# Patient Record
Sex: Female | Born: 1970 | Race: White | Hispanic: No | Smoking: Current every day smoker
Health system: Southern US, Community
[De-identification: ages and names within clinical notes are randomized; demographics above are authoritative.]

## PROBLEM LIST (undated history)

## (undated) DIAGNOSIS — K5090 Crohn's disease, unspecified, without complications: Secondary | ICD-10-CM

## (undated) DIAGNOSIS — K589 Irritable bowel syndrome without diarrhea: Secondary | ICD-10-CM

## (undated) DIAGNOSIS — K635 Polyp of colon: Secondary | ICD-10-CM

## (undated) DIAGNOSIS — K611 Rectal abscess: Secondary | ICD-10-CM

## (undated) DIAGNOSIS — R5383 Other fatigue: Secondary | ICD-10-CM

## (undated) DIAGNOSIS — F1020 Alcohol dependence, uncomplicated: Secondary | ICD-10-CM

## (undated) DIAGNOSIS — E538 Deficiency of other specified B group vitamins: Secondary | ICD-10-CM

## (undated) DIAGNOSIS — F329 Major depressive disorder, single episode, unspecified: Secondary | ICD-10-CM

## (undated) DIAGNOSIS — Z803 Family history of malignant neoplasm of breast: Secondary | ICD-10-CM

## (undated) DIAGNOSIS — K602 Anal fissure, unspecified: Secondary | ICD-10-CM

## (undated) HISTORY — DX: Deficiency of other specified B group vitamins: E53.8

## (undated) HISTORY — DX: Major depressive disorder, single episode, unspecified: F32.9

## (undated) HISTORY — PX: OTHER SURGICAL HISTORY: SHX169

## (undated) HISTORY — PX: COLON SURGERY: SHX602

## (undated) HISTORY — DX: Family history of malignant neoplasm of breast: Z80.3

## (undated) HISTORY — DX: Crohn's disease, unspecified, without complications: K50.90

## (undated) HISTORY — DX: Rectal abscess: K61.1

## (undated) HISTORY — DX: Alcohol dependence, uncomplicated: F10.20

## (undated) HISTORY — DX: Polyp of colon: K63.5

## (undated) HISTORY — DX: Anal fissure, unspecified: K60.2

## (undated) HISTORY — DX: Irritable bowel syndrome, unspecified: K58.9

## (undated) HISTORY — DX: Other fatigue: R53.83

---

## 1989-12-29 DIAGNOSIS — K611 Rectal abscess: Secondary | ICD-10-CM

## 1989-12-29 HISTORY — DX: Rectal abscess: K61.1

## 1998-01-19 ENCOUNTER — Encounter: Payer: Self-pay | Admitting: Internal Medicine

## 1998-09-21 ENCOUNTER — Encounter: Payer: Self-pay | Admitting: Internal Medicine

## 1998-09-21 ENCOUNTER — Ambulatory Visit (HOSPITAL_COMMUNITY): Admission: RE | Admit: 1998-09-21 | Discharge: 1998-09-21 | Payer: Self-pay | Admitting: Gastroenterology

## 1998-10-11 ENCOUNTER — Other Ambulatory Visit: Admission: RE | Admit: 1998-10-11 | Discharge: 1998-10-11 | Payer: Self-pay | Admitting: Obstetrics & Gynecology

## 1999-09-18 ENCOUNTER — Other Ambulatory Visit: Admission: RE | Admit: 1999-09-18 | Discharge: 1999-09-18 | Payer: Self-pay | Admitting: Obstetrics & Gynecology

## 2000-06-05 ENCOUNTER — Ambulatory Visit (HOSPITAL_COMMUNITY): Admission: RE | Admit: 2000-06-05 | Discharge: 2000-06-05 | Payer: Self-pay | Admitting: Gastroenterology

## 2000-06-05 ENCOUNTER — Encounter: Payer: Self-pay | Admitting: Internal Medicine

## 2000-06-05 ENCOUNTER — Encounter (INDEPENDENT_AMBULATORY_CARE_PROVIDER_SITE_OTHER): Payer: Self-pay | Admitting: *Deleted

## 2000-11-18 ENCOUNTER — Other Ambulatory Visit: Admission: RE | Admit: 2000-11-18 | Discharge: 2000-11-18 | Payer: Self-pay | Admitting: Obstetrics & Gynecology

## 2002-02-02 ENCOUNTER — Other Ambulatory Visit: Admission: RE | Admit: 2002-02-02 | Discharge: 2002-02-02 | Payer: Self-pay | Admitting: Obstetrics & Gynecology

## 2003-09-13 ENCOUNTER — Other Ambulatory Visit: Admission: RE | Admit: 2003-09-13 | Discharge: 2003-09-13 | Payer: Self-pay | Admitting: Obstetrics and Gynecology

## 2006-08-27 ENCOUNTER — Encounter (INDEPENDENT_AMBULATORY_CARE_PROVIDER_SITE_OTHER): Payer: Self-pay | Admitting: *Deleted

## 2006-08-27 ENCOUNTER — Emergency Department (HOSPITAL_COMMUNITY): Admission: EM | Admit: 2006-08-27 | Discharge: 2006-08-27 | Payer: Self-pay | Admitting: Emergency Medicine

## 2006-09-21 ENCOUNTER — Ambulatory Visit: Payer: Self-pay | Admitting: Internal Medicine

## 2009-01-25 ENCOUNTER — Encounter (INDEPENDENT_AMBULATORY_CARE_PROVIDER_SITE_OTHER): Payer: Self-pay | Admitting: *Deleted

## 2009-01-25 ENCOUNTER — Emergency Department (HOSPITAL_COMMUNITY): Admission: EM | Admit: 2009-01-25 | Discharge: 2009-01-25 | Payer: Self-pay | Admitting: Emergency Medicine

## 2009-01-30 ENCOUNTER — Ambulatory Visit: Payer: Self-pay | Admitting: Internal Medicine

## 2009-01-30 DIAGNOSIS — K5080 Crohn's disease of both small and large intestine without complications: Secondary | ICD-10-CM | POA: Insufficient documentation

## 2009-01-31 ENCOUNTER — Telehealth: Payer: Self-pay | Admitting: Internal Medicine

## 2009-02-02 ENCOUNTER — Telehealth: Payer: Self-pay | Admitting: Internal Medicine

## 2009-02-07 ENCOUNTER — Encounter: Payer: Self-pay | Admitting: Internal Medicine

## 2009-02-07 ENCOUNTER — Telehealth: Payer: Self-pay | Admitting: Internal Medicine

## 2009-02-19 ENCOUNTER — Ambulatory Visit: Payer: Self-pay | Admitting: Internal Medicine

## 2009-02-19 ENCOUNTER — Encounter (INDEPENDENT_AMBULATORY_CARE_PROVIDER_SITE_OTHER): Payer: Self-pay | Admitting: Internal Medicine

## 2009-02-20 ENCOUNTER — Encounter: Payer: Self-pay | Admitting: Internal Medicine

## 2009-02-20 ENCOUNTER — Encounter (INDEPENDENT_AMBULATORY_CARE_PROVIDER_SITE_OTHER): Payer: Self-pay | Admitting: Internal Medicine

## 2009-02-27 ENCOUNTER — Ambulatory Visit: Payer: Self-pay | Admitting: Internal Medicine

## 2009-02-28 ENCOUNTER — Telehealth: Payer: Self-pay | Admitting: Internal Medicine

## 2009-03-05 ENCOUNTER — Telehealth: Payer: Self-pay | Admitting: Internal Medicine

## 2009-03-09 ENCOUNTER — Telehealth: Payer: Self-pay | Admitting: Internal Medicine

## 2009-03-14 ENCOUNTER — Ambulatory Visit (HOSPITAL_COMMUNITY): Admission: RE | Admit: 2009-03-14 | Discharge: 2009-03-14 | Payer: Self-pay | Admitting: Internal Medicine

## 2009-04-02 ENCOUNTER — Telehealth: Payer: Self-pay | Admitting: Internal Medicine

## 2009-04-03 ENCOUNTER — Ambulatory Visit: Payer: Self-pay | Admitting: Internal Medicine

## 2009-04-03 ENCOUNTER — Ambulatory Visit: Payer: Self-pay | Admitting: *Deleted

## 2009-04-03 ENCOUNTER — Inpatient Hospital Stay (HOSPITAL_COMMUNITY): Admission: AD | Admit: 2009-04-03 | Discharge: 2009-04-07 | Payer: Self-pay | Admitting: *Deleted

## 2009-04-03 DIAGNOSIS — F329 Major depressive disorder, single episode, unspecified: Secondary | ICD-10-CM

## 2009-04-03 DIAGNOSIS — K612 Anorectal abscess: Secondary | ICD-10-CM

## 2009-04-04 ENCOUNTER — Encounter (INDEPENDENT_AMBULATORY_CARE_PROVIDER_SITE_OTHER): Payer: Self-pay | Admitting: *Deleted

## 2009-04-04 ENCOUNTER — Encounter: Payer: Self-pay | Admitting: Internal Medicine

## 2009-04-05 ENCOUNTER — Encounter (INDEPENDENT_AMBULATORY_CARE_PROVIDER_SITE_OTHER): Payer: Self-pay | Admitting: General Surgery

## 2009-04-11 ENCOUNTER — Telehealth: Payer: Self-pay | Admitting: Internal Medicine

## 2009-04-16 ENCOUNTER — Telehealth: Payer: Self-pay | Admitting: Internal Medicine

## 2009-04-19 ENCOUNTER — Ambulatory Visit: Payer: Self-pay | Admitting: Infectious Disease

## 2009-04-26 ENCOUNTER — Telehealth: Payer: Self-pay | Admitting: Internal Medicine

## 2009-04-28 HISTORY — PX: COLON RESECTION: SHX5231

## 2009-04-28 HISTORY — PX: SMALL INTESTINE SURGERY: SHX150

## 2009-05-04 ENCOUNTER — Encounter: Payer: Self-pay | Admitting: Licensed Clinical Social Worker

## 2009-05-09 ENCOUNTER — Telehealth: Payer: Self-pay | Admitting: Internal Medicine

## 2009-05-17 ENCOUNTER — Telehealth: Payer: Self-pay | Admitting: Internal Medicine

## 2009-05-21 ENCOUNTER — Encounter (INDEPENDENT_AMBULATORY_CARE_PROVIDER_SITE_OTHER): Payer: Self-pay | Admitting: *Deleted

## 2009-05-21 ENCOUNTER — Encounter: Admission: RE | Admit: 2009-05-21 | Discharge: 2009-05-21 | Payer: Self-pay | Admitting: General Surgery

## 2009-05-23 ENCOUNTER — Ambulatory Visit: Payer: Self-pay | Admitting: Internal Medicine

## 2009-05-24 ENCOUNTER — Inpatient Hospital Stay (HOSPITAL_COMMUNITY): Admission: AD | Admit: 2009-05-24 | Discharge: 2009-06-02 | Payer: Self-pay | Admitting: General Surgery

## 2009-05-24 ENCOUNTER — Encounter (INDEPENDENT_AMBULATORY_CARE_PROVIDER_SITE_OTHER): Payer: Self-pay | Admitting: General Surgery

## 2009-06-05 ENCOUNTER — Ambulatory Visit: Payer: Self-pay | Admitting: Internal Medicine

## 2009-06-14 ENCOUNTER — Ambulatory Visit: Payer: Self-pay | Admitting: Internal Medicine

## 2009-06-14 ENCOUNTER — Encounter (INDEPENDENT_AMBULATORY_CARE_PROVIDER_SITE_OTHER): Payer: Self-pay | Admitting: Internal Medicine

## 2009-06-14 DIAGNOSIS — R109 Unspecified abdominal pain: Secondary | ICD-10-CM | POA: Insufficient documentation

## 2009-06-14 LAB — CONVERTED CEMR LAB
Ketones, ur: NEGATIVE mg/dL
Nitrite: NEGATIVE
Specific Gravity, Urine: 1.025 (ref 1.005–1.030)
Urobilinogen, UA: 0.2 (ref 0.0–1.0)

## 2009-07-19 ENCOUNTER — Encounter: Payer: Self-pay | Admitting: Internal Medicine

## 2009-07-30 ENCOUNTER — Encounter (INDEPENDENT_AMBULATORY_CARE_PROVIDER_SITE_OTHER): Payer: Self-pay | Admitting: Internal Medicine

## 2009-08-20 ENCOUNTER — Ambulatory Visit: Payer: Self-pay | Admitting: Internal Medicine

## 2009-11-12 ENCOUNTER — Encounter (INDEPENDENT_AMBULATORY_CARE_PROVIDER_SITE_OTHER): Payer: Self-pay | Admitting: Internal Medicine

## 2009-12-03 ENCOUNTER — Ambulatory Visit: Payer: Self-pay | Admitting: Internal Medicine

## 2009-12-04 ENCOUNTER — Telehealth (INDEPENDENT_AMBULATORY_CARE_PROVIDER_SITE_OTHER): Payer: Self-pay | Admitting: Internal Medicine

## 2009-12-04 LAB — CONVERTED CEMR LAB
Basophils Relative: 0.5 % (ref 0.0–3.0)
CRP, High Sensitivity: 2.6 (ref 0.00–5.00)
Eosinophils Relative: 2.6 % (ref 0.0–5.0)
HCT: 42.3 % (ref 36.0–46.0)
Lymphs Abs: 1.9 10*3/uL (ref 0.7–4.0)
MCHC: 33.7 g/dL (ref 30.0–36.0)
MCV: 95.6 fL (ref 78.0–100.0)
Monocytes Absolute: 0.4 10*3/uL (ref 0.1–1.0)
Platelets: 234 10*3/uL (ref 150.0–400.0)
WBC: 5.9 10*3/uL (ref 4.5–10.5)

## 2009-12-05 DIAGNOSIS — E538 Deficiency of other specified B group vitamins: Secondary | ICD-10-CM

## 2009-12-06 ENCOUNTER — Ambulatory Visit: Payer: Self-pay | Admitting: Internal Medicine

## 2009-12-11 ENCOUNTER — Ambulatory Visit: Payer: Self-pay | Admitting: Infectious Diseases

## 2009-12-18 ENCOUNTER — Ambulatory Visit: Payer: Self-pay | Admitting: Internal Medicine

## 2009-12-25 ENCOUNTER — Ambulatory Visit: Payer: Self-pay | Admitting: Internal Medicine

## 2009-12-25 ENCOUNTER — Encounter (INDEPENDENT_AMBULATORY_CARE_PROVIDER_SITE_OTHER): Payer: Self-pay | Admitting: Internal Medicine

## 2010-01-24 ENCOUNTER — Ambulatory Visit: Payer: Self-pay | Admitting: Internal Medicine

## 2010-02-06 ENCOUNTER — Encounter (INDEPENDENT_AMBULATORY_CARE_PROVIDER_SITE_OTHER): Payer: Self-pay | Admitting: *Deleted

## 2010-02-25 ENCOUNTER — Ambulatory Visit: Payer: Self-pay | Admitting: Internal Medicine

## 2010-03-04 ENCOUNTER — Ambulatory Visit: Payer: Self-pay | Admitting: Internal Medicine

## 2010-03-27 ENCOUNTER — Ambulatory Visit: Payer: Self-pay | Admitting: Internal Medicine

## 2010-04-25 ENCOUNTER — Ambulatory Visit: Payer: Self-pay | Admitting: Internal Medicine

## 2010-04-29 ENCOUNTER — Encounter: Payer: Self-pay | Admitting: Internal Medicine

## 2010-04-30 ENCOUNTER — Ambulatory Visit: Payer: Self-pay | Admitting: Infectious Disease

## 2010-05-31 ENCOUNTER — Ambulatory Visit: Payer: Self-pay | Admitting: Internal Medicine

## 2010-07-02 ENCOUNTER — Ambulatory Visit: Payer: Self-pay | Admitting: Internal Medicine

## 2010-07-25 ENCOUNTER — Telehealth (INDEPENDENT_AMBULATORY_CARE_PROVIDER_SITE_OTHER): Payer: Self-pay | Admitting: *Deleted

## 2010-12-11 ENCOUNTER — Ambulatory Visit: Payer: Self-pay | Admitting: Internal Medicine

## 2010-12-11 DIAGNOSIS — IMO0002 Reserved for concepts with insufficient information to code with codable children: Secondary | ICD-10-CM

## 2010-12-11 LAB — CONVERTED CEMR LAB
CRP, High Sensitivity: 1.2 (ref 0.00–5.00)
Eosinophils Relative: 1.8 % (ref 0.0–5.0)
HCT: 41.2 % (ref 36.0–46.0)
Hemoglobin: 14.8 g/dL (ref 12.0–15.0)
Lymphs Abs: 3.1 10*3/uL (ref 0.7–4.0)
Monocytes Relative: 6.7 % (ref 3.0–12.0)
Neutro Abs: 4.6 10*3/uL (ref 1.4–7.7)
WBC: 8.4 10*3/uL (ref 4.5–10.5)

## 2011-01-08 ENCOUNTER — Telehealth (INDEPENDENT_AMBULATORY_CARE_PROVIDER_SITE_OTHER): Payer: Self-pay | Admitting: *Deleted

## 2011-01-28 NOTE — Letter (Signed)
Summary: [facility] Instructions  [facility]  [place], [place] [address]   Phone: [phone]  Fax: [phone]       [name]    1971/02/18    MRN: [id number]        Procedure Day /Date:THURSDAY, 04/25/10     Arrival Time:1:00 PM     Procedure Time:2:00 PM     Location of Procedure:                    [facility] (4th Floor)   [place]   Starting 5 days prior to your procedure 04/20/10 do not eat nuts, seeds, popcorn, corn, beans, peas,  salads, or any raw vegetables.  Do not take any fiber supplements (e.g. Metamucil, Citrucel, and Benefiber).  THE DAY BEFORE YOUR PROCEDURE         DATE: 04/24/10  DAY: WEDNESDAY 1.  Drink clear liquids the entire day-NO SOLID FOOD  2.  Do not drink anything colored red or purple.  Avoid juices with pulp.  No orange juice.  3.  Drink at least 64 oz. (8 glasses) of fluid/clear liquids during the day to prevent dehydration and help the prep work efficiently.  CLEAR LIQUIDS INCLUDE: Water Jello Ice Popsicles Tea (sugar ok, no milk/cream) Powdered fruit flavored drinks Coffee (sugar ok, no milk/cream) Gatorade Juice: apple, white grape, white cranberry  Lemonade Clear bullion, consomm, broth Carbonated beverages (any kind) Strained chicken noodle soup Hard Candy                             4.  In the morning, mix first dose of MoviPrep solution:    Empty 1 Pouch A and 1 Pouch B into the disposable container    Add lukewarm drinking water to the top line of the container. Mix to dissolve    Refrigerate (mixed solution should be used within 24 hrs)  5.  Begin drinking the prep at 5:00 p.m. The MoviPrep container is divided by 4 marks.   Every 15 minutes drink the solution down to the next mark (approximately 8 oz) until the full liter is complete.   6.  Follow completed prep with 16 oz of clear liquid of your choice (Nothing red or purple).  Continue  to drink clear liquids until bedtime.  7.  Before going to bed, mix second dose of MoviPrep solution:    Empty 1 Pouch A and 1 Pouch B into the disposable container    Add lukewarm drinking water to the top line of the container. Mix to dissolve    Refrigerate  THE DAY OF YOUR PROCEDURE      DATE: 04/25/10 DAY: THURSDAY  Beginning at 9:00a.m. (5 hours before procedure):         1. Every 15 minutes, drink the solution down to the next mark (approx 8 oz) until the full liter is complete.  2. Follow completed prep with 16 oz. of clear liquid of your choice.    3. You may drink clear liquids until 12 NOON (2 HOURS BEFORE PROCEDURE).   MEDICATION INSTRUCTIONS  Unless otherwise instructed, you should take regular prescription medications with a small sip of water   as early as possible the morning of your procedure.           OTHER INSTRUCTIONS  You will need a responsible adult at least 42 years of age to  accompany you and drive you home.   This person must remain in the waiting room during your procedure.  Wear loose fitting clothing that is easily removed.  Leave jewelry and other valuables at home.  However, you may wish to bring a book to read or  an iPod/MP3 player to listen to music as you wait for your procedure to start.  Remove all body piercing jewelry and leave at home.  Total time from sign-in until discharge is approximately 2-3 hours.  You should go home directly after your procedure and rest.  You can resume normal activities the  day after your procedure.  The day of your procedure you should not:   Drive   Make legal decisions   Operate machinery   Drink alcohol   Return to work  You will receive specific instructions about eating, activities and medications before you leave.    The above instructions have been reviewed and explained to me by   _______________________    I fully understand and can verbalize these instructions  _____________________________ Date _________

## 2011-01-28 NOTE — Assessment & Plan Note (Signed)
Summary: MONTHLY B12 INJ/CH  Nurse Visit   Allergies: No Known Drug Allergies  Medication Administration  Injection # 1:    Medication: Vit B12 1000 mcg    Diagnosis: VITAMIN B12 DEFICIENCY (ICD-266.2)    Route: IM    Site: L deltoid    Exp Date: 03/2012    Lot #: [id number]    Mfr: [place]    Patient tolerated injection without complications    Given by: Sander Nephew RN (May 31, 2010 2:07 PM)  Orders Added: 1)  Vit B12 1000 mcg [J3420] 2)  Admin of Therapeutic Inj  intramuscular or subcutaneous [96372]   Medication Administration  Injection # 1:    Medication: Vit B12 1000 mcg    Diagnosis: VITAMIN B12 DEFICIENCY (ICD-266.2)    Route: IM    Site: L deltoid    Exp Date: 03/2012    Lot #: [id number]    Mfr: [place]    Patient tolerated injection without complications    Given by: Sander Nephew RN (May 31, 2010 2:07 PM)  Orders Added: 1)  Vit B12 1000 mcg [J3420] 2)  Admin of Therapeutic Inj  intramuscular or subcutaneous [[id number]]

## 2011-01-28 NOTE — Letter (Signed)
Summary: Patient Notice- Polyp Results  [facility]  [address] [place], [place] [address]   Phone: [phone]  Fax: [phone]        Apr 29, 2010 MRN: [id number]    Glenda ZIRKELBACH [address] [place],   [address]    Dear [name],  I am pleased to inform you that the colon polyp(s) removed during your recent colonoscopy was (were) found to be benign (no cancer detected) upon pathologic examination.   Also, as we talked about, no evidence for active Crohn's on the recent colonoscopy examination.    Additional information/recommendations:  __ No further action with gastroenterology is needed at this time. Please      follow-up with your primary care physician for your other healthcare      needs.  __ Please call [phone] to schedule a return visit to review your      situation in 3 months.   Please call us if you are having persistent problems or have questions about your condition that have not been fully answered at this time.  Sincerely,  Irene Shipper MD  This letter has been electronically signed by your physician.  Appended Document: Patient Notice- Polyp Results letter mailed 5.3.11

## 2011-01-28 NOTE — Letter (Signed)
Summary: Office Visit Letter  [facility]  [address] [place], [place] [address]   Phone: [phone]  Fax: [phone]      February 06, 2010 MRN: [id number]   [facility] [address] [place], [place]  [address]   Dear [name],   According to our records, it is time for you to schedule a follow-up office visit with [place] in the month of March 2011.   At your convenience, please call [phone] (option #2)to schedule an office visit. If you have any questions, concerns, or feel that this letter is in error, we would appreciate your call.   Sincerely,  Docia Chuck. [name], M.D.   [facility] [phone]

## 2011-01-28 NOTE — Assessment & Plan Note (Signed)
Summary: B-12/VS  Nurse Visit   Allergies: No Known Drug Allergies  Medication Administration  Injection # 1:    Medication: Vit B12 1000 mcg    Diagnosis: VITAMIN B12 DEFICIENCY (ICD-266.2)    Route: IM    Site: L deltoid    Exp Date: 09/2011    Lot #: [id number]    Mfr: [employer] [place]    Patient tolerated injection without complications    Given by: Freddy Finner RN (January 24, 2010 2:17 PM)  Orders Added: 1)  Vit B12 1000 mcg [J3420] 2)  Admin of Therapeutic Inj  intramuscular or subcutaneous [[id number]]

## 2011-01-28 NOTE — Assessment & Plan Note (Signed)
Summary: B-12 INJ/CFB  Nurse Visit   Allergies: No Known Drug Allergies  Medication Administration  Injection # 1:    Medication: Vit B12 1000 mcg    Diagnosis: VITAMIN B12 DEFICIENCY (ICD-266.2)    Route: IM    Site: L deltoid    Exp Date: 01/30/2012    Lot #: [id number]    Mfr: [employer]    Patient tolerated injection without complications    Given by: Gevena Cotton RN (Apr 30, 2010 10:01 AM)  Orders Added: 1)  Vit B12 1000 mcg [J3420] 2)  Admin of Therapeutic Inj  intramuscular or subcutaneous [[id number]]

## 2011-01-28 NOTE — Procedures (Signed)
Summary: Colonoscopy  Patient: [name] Note: All result statuses are Final unless otherwise noted.  Tests: (1) Colonoscopy (COL)   COL Colonoscopy           [facility]     [place], [place]  [address]           COLONOSCOPY PROCEDURE REPORT           PATIENT:  [name], [name]  MR#:  [id number]     BIRTHDATE:  Jun 03, 1971, 38 yrs. old  GENDER:  female     ENDOSCOPIST:  Docia Chuck. Geri Seminole, MD     REF. BY:  Office     PROCEDURE DATE:  04/25/2010     PROCEDURE:  Colonoscopy with snare polypectomy x 2     ASA CLASS:  Class II     INDICATIONS:  Crohn's disease ; post-op assessment     MEDICATIONS:   Fentanyl 75 mcg IV, Versed 7 mg IV, Benadryl 25 mg     IV           DESCRIPTION OF PROCEDURE:   After the risks benefits and     alternatives of the procedure were thoroughly explained, informed     consent was obtained.  Digital rectal exam was performed and     revealed no abnormalities.   The LB [id number] endoscope     was introduced through the stoma initially and advanced to the     ileum, without limitations. Later advanced thru anaus for 10=12cm.     The quality of the prep was excellent, using MoviPrep.  The     instrument was then slowly withdrawn as the colon was fully     examined.     <<PROCEDUREIMAGES>>     FINDINGS:  The stoma was unremarkable. The colonic mucosa was     normal without evidence of active Crohn's disease.The anastomosis     was normal except for a nonpecific focal ulcer next to a     suture.The neo-terminal ileum appeared normal x 20cm. Two 66m     polyps     (ascending and transverse) were removed with cold snare and     retrieved. Via the anus, the rectal musosa was unremarkable w/     mucous plug present.   Unable to retroflex.    The scope was then     withdrawn from the patient and the procedure completed.           COMPLICATIONS:  None     ENDOSCOPIC IMPRESSION:     1) Normal neo-terminal ileum     2)  Normal colonic mucosa     3) Two polypds removed     4) NO ACTIVE CROHN'S POST-OP           RECOMMENDATIONS:     1) Follow up colonoscopy in 5 years     2) OFFICE VISIT WITH DR PHenrene PastorIN 3 MONTHS           ______________________________     JDocia Chuck PGeri Seminole MD           CC:  WJeanella Anton MD;  [facility]           n.     eSIGNED:   JDocia Chuck PGeri Seminoleat 04/25/2010 02:54 PM           BCarlyon Shadow  [phone]  Note: An exclamation mark (!) indicates a result that was not dispersed into the flowsheet. Document Creation Date: 04/25/2010 2:54 PM _______________________________________________________________________  (1) Order result status: Final Collection or observation date-time: 04/25/2010 14:36 Requested date-time:  Receipt date-time:  Reported date-time:  Referring Physician:   Ordering Physician: Lavena Bullion [phone]) Specimen Source:  Source: Tawanna Cooler Order Number: [id number] Lab site:   Appended Document: Colonoscopy repeat colonoscopy in 5 years  Appended Document: Colonoscopy recall in 5 yrs/03-2015/aw     Procedures Next Due Date:    Colonoscopy: 04/2015

## 2011-01-28 NOTE — Assessment & Plan Note (Signed)
Summary: 3 MO F/U.Marland KitchenCrohn's   History of Present Illness Visit Type: Follow-up Visit Primary GI MD: Scarlette Shorts MD Primary Provider: Dawna Part, MD Requesting Provider: n/a Chief Complaint: Pain around stoma  History of Present Illness:   40 year old female with complicated Crohn's disease for which she has undergone partial resection of the ileum and colon with subsequent colostomy in May 2010. She also has a history of anxiety/depression, B12 deficiency, perirectal abscess and chronic tobacco abuse. She was last evaluated in the office D. December 03, 2009. Aside from some discomfort around her stoma, she was doing well. Laboratories revealed a B12 deficiency for which she is getting regular replacement therapy at the outpatient clinic. Her CBC and C-reactive protein were normal. She continues with some minor burning discomfort around the ostomy, likely related to skin irritation. She shows me a picture on her cell phone. She has mentioned this to the surgeon. Overall her quality of life has improved but only in terms of her physical condition, but also in terms of her social situation. She now has Medicaid. She was hoping for disability. She is living with her father. Her GI review of systems is negative. No dominant pain or blood per ostomy contents. Unfortunately, she continues to smoke.   GI Review of Systems      Denies abdominal pain, acid reflux, belching, bloating, chest pain, dysphagia with liquids, dysphagia with solids, heartburn, loss of appetite, nausea, vomiting, vomiting blood, weight loss, and  weight gain.        Denies anal fissure, black tarry stools, change in bowel habit, constipation, diarrhea, diverticulosis, fecal incontinence, heme positive stool, hemorrhoids, irritable bowel syndrome, jaundice, light color stool, liver problems, rectal bleeding, and  rectal pain.    Current Medications (verified): 1)  Vicodin 5-500 Mg Tabs (Hydrocodone-Acetaminophen) .... As  Needed 2)  Caltrate With Vit D/62m/400iu .... Once Daily 3)  Fish Oil 1000 Mg Caps (Omega-3 Fatty Acids) .... Once Daily 4)  Potassium Gluconate 99 Mg .... Once Daily 5)  Vit B12 2533m .... Once Daily 6)  Cyanocobalamin 1000 Mcg/ml Soln (Cyanocobalamin) .... Once A Month  Allergies (verified): No Known Drug Allergies  Past History:  Past Medical History: Reviewed history from 05/23/2009 and no changes required. Crohns Disease  Past Surgical History: Reviewed history from 06/05/2009 and no changes required. Rectal fistulotomy x 2 Colon Resection  Family History: Reviewed history from 01/30/2009 and no changes required. Family History of Colon Cancer:aunt Family History of Diabetes: father  Family History of Colitis/Crohn's: sister with Crohn's disease  Social History: Reviewed history from 01/30/2009 and no changes required. Patient currently smokes. 1/2 ppd  Alcohol Use - yes beer 12 pack per week  Daily Caffeine Use 2 per day Patient gets regular exercise. separated without children Unemployed and uninsured  Review of Systems       The patient complains of anxiety-new.  The patient denies allergy/sinus, anemia, arthritis/joint pain, back pain, blood in urine, breast changes/lumps, change in vision, confusion, cough, coughing up blood, depression-new, fainting, fatigue, fever, headaches-new, hearing problems, heart murmur, heart rhythm changes, itching, menstrual pain, muscle pains/cramps, night sweats, nosebleeds, pregnancy symptoms, shortness of breath, skin rash, sleeping problems, sore throat, swelling of feet/legs, swollen lymph glands, thirst - excessive , urination - excessive , urination changes/pain, urine leakage, vision changes, and voice change.    Vital Signs:  Patient profile:   [age]ear old female Height:      69 inches Weight:      166 pounds BMI:  24.60 BSA:     1.91 Pulse rate:   74 / minute Pulse rhythm:   regular BP sitting:   100 / 62  (left  arm) Cuff size:   regular  Vitals Entered By: Hope Pigeon CMA (March 04, 2010 1:55 PM)  Physical Exam  General:  Well developed, well nourished, no acute distress. Head:  Normocephalic and atraumatic. Eyes:  PERRLA, no icterus. Ears:  Normal auditory acuity. Nose:  No deformity, discharge,  or lesions. Mouth:  No deformity or lesions, . Poor dentition Neck:  Supple; no masses or thyromegaly. Lungs:  Clear throughout to auscultation. Heart:  Regular rate and rhythm; no murmurs, rubs,  or bruits. Abdomen:  Soft, nontender and nondistended. No masses, hepatosplenomegaly or hernias noted. Normal bowel sounds. Unremarkable ostomy. Msk:  Symmetrical with no gross deformities. Normal posture. Pulses:  Normal pulses noted. Extremities:  No clubbing, cyanosis, edema or deformities noted. Neurologic:  Alert and  oriented x4;  grossly normal neurologically. Skin:  Intact without significant lesions or rashes. Psych:  Alert and cooperative. Normal mood and affect.   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 Seemingly doing well 10 months after extensive surgery. Last laboratory profile unremarkable except for B12 deficiency. Only complaint is skin irritation around the stoma.  Plan: #1. Desitin cream to the affected skin area #2. Schedule colonoscopy to assess for evidence of active Crohn's disease and assist with a decision regarding medical therapy postoperatively in an effort to reduce the risk of premature recurrence of disease. Specifically immunomodulatory therapy. The nature of colonoscopy as well as the risks, benefits, and alternatives were reviewed. She understood and agreed to proceed. #3. Movi prep prescribed. The patient instructed on its use. #4. Importance of discontinuing smoking again emphasized  Problem # 2:  VITAMIN B12 DEFICIENCY (ICD-266.2) B12 deficiency due to ileal disease/ileal  resection. Currently on replacement therapy.  Plan: #1. Continue B12 shots indefinitely  Problem # 3:  DEPRESSION (ICD-311) seems to be doing better. Is seeing a therapist.  Other Orders: Colonoscopy (Colon)  Patient Instructions: 1)  Colonoscopy [place] 04/25/10 2:00 pm  2)  Movi prep instructions given to patient and Rx. sent to patient's pharmacy for pickup. 3)  Colonoscopy and Flexible Sigmoidoscopy brochure given.  4)  The medication list was reviewed and reconciled.  All changed / newly prescribed medications were explained.  A complete medication list was provided to the patient / caregiver. 5)  Patient instructions printed and given to patient. Randye Lobo [facility]  March 04, 2010 2:27 PM 6)  Copy: Dr. Jeanella Anton, [facility] outpatient [facility] Prescriptions: MOVIPREP 100 GM  SOLR (PEG-KCL-NACL-NASULF-NA ASC-C) As per prep instructions.  #1 x 0   Entered by:   Randye Lobo NCMA   Authorized by:   Irene Shipper MD   Signed by:   Randye Lobo NCMA on 03/04/2010   Method used:   Electronically to        [facility] [phone]* (retail)       [address]       [place], [place]  [address]       Ph: [phone]       Fax: [phone]   RxID:   [id number]

## 2011-01-28 NOTE — Assessment & Plan Note (Signed)
Summary: MONTHLY B12 INJ/CH  Nurse Visit   Allergies: No Known Drug Allergies  Medication Administration  Injection # 1:    Medication: Vit B12 1000 mcg    Diagnosis: VITAMIN B12 DEFICIENCY (ICD-266.2)    Route: IM    Site: L deltoid    Exp Date: 01/30/2012    Lot #: [id number]    Mfr: [employer]    Patient tolerated injection without complications    Given by: Gevena Cotton RN (July 02, 2010 3:24 PM)  Orders Added: 1)  Vit B12 1000 mcg [J3420] 2)  Admin of Therapeutic Inj  intramuscular or subcutaneous [[id number]]

## 2011-01-28 NOTE — Progress Notes (Signed)
  Phone Note Other Incoming   Request: Send information Summary of Call: Request for records received from [facility]. Request forwarded to [employer].     

## 2011-01-28 NOTE — Assessment & Plan Note (Signed)
Summary: B-12/VS  Nurse Visit   Allergies: No Known Drug Allergies  Medication Administration  Injection # 1:    Medication: Vit B12 1000 mcg    Diagnosis: VITAMIN B12 DEFICIENCY (ICD-266.2)    Route: IM    Site: L deltoid    Exp Date: 10/2011    Lot #: [id number]    Mfr: [place]    Patient tolerated injection without complications    Given by: Freddy Finner RN (March 27, 2010 1:44 PM)  Orders Added: 1)  Admin of Therapeutic Inj  intramuscular or subcutaneous [[id number]] 2)  Vit B12 1000 mcg [J3420]

## 2011-01-28 NOTE — Assessment & Plan Note (Signed)
Summary: MONTHLY B12 SHOT/CH  Nurse Visit   Allergies: No Known Drug Allergies  Medication Administration  Injection # 1:    Medication: Vit B12 1000 mcg    Diagnosis: VITAMIN B12 DEFICIENCY (ICD-266.2)    Route: IM    Site: L deltoid    Exp Date: 10/2011    Lot #: [id number]    Mfr: [place]    Patient tolerated injection without complications    Given by: Sander Nephew RN (February 25, 2010 1:51 PM)  Orders Added: 1)  Vit B12 1000 mcg [J3420] 2)  Admin of Therapeutic Inj  intravenous [[id number]]

## 2011-01-30 NOTE — Assessment & Plan Note (Signed)
Summary: Crohn's-6 month followup (ostomy pain)   History of Present Illness Visit Type: Follow-up Visit Primary GI MD: Scarlette Shorts MD Primary Provider: Marshall Cork Dewar, PA  Requesting Provider: n/a Chief Complaint: F/u for Crohn's. Pt states that she is having pain with BM History of Present Illness:   40 year old female with complicated Crohn's disease for which she underwent partial resection of the ileum and colon with subsequent colostomy in May of 2010. Also a history of perirectal abscess, B12 deficiency, chronic tobacco abuse, and anxiety/depression. She was last seen March 2011 in the office. At that time having some discomfort with the skin around the stoma. She subsequently underwent colonoscopy April 25, 2010. This revealed a normal neo-ileum and normal residual colonic mucosa. 2 small polyps removed. He now returns for routine followup. Good appetite without nausea or vomiting. Unfortunately she continues to smoke. No abdominal pain. She continues with severe burning discomfort when she passes stool contents through the ostomy. She has seen an ostomy nurse. She recently saw her surgeon, Dr. [name].   GI Review of Systems      Denies abdominal pain, acid reflux, belching, bloating, chest pain, dysphagia with liquids, dysphagia with solids, heartburn, loss of appetite, nausea, vomiting, vomiting blood, weight loss, and  weight gain.        Denies anal fissure, black tarry stools, change in bowel habit, constipation, diarrhea, diverticulosis, fecal incontinence, heme positive stool, hemorrhoids, irritable bowel syndrome, jaundice, light color stool, liver problems, rectal bleeding, and  rectal pain.    Current Medications (verified): 1)  Cyanocobalamin 1000 Mcg/ml Soln (Cyanocobalamin) .... Every Two Weeks 2)  Aleve 220 Mg Tabs (Naproxen Sodium) .... 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 Family History of Colon Cancer:aunt Family History of Diabetes: father  Family History of Colitis/Crohn's: sister with Crohn's disease  Social History: Reviewed history from 01/30/2009 and no changes required. Patient currently smokes. 1/2 ppd  Alcohol Use - yes beer 12 pack per week  Daily Caffeine Use 2 per day Patient gets regular exercise. separated without children Unemployed and uninsured  Review of Systems  The patient denies allergy/sinus, anemia, anxiety-new, arthritis/joint pain, back pain, blood in urine, breast changes/lumps, change in vision, confusion, cough, coughing up blood, depression-new, fainting, fatigue, fever, headaches-new, hearing problems, heart murmur, heart rhythm changes, itching, menstrual pain, muscle pains/cramps, night sweats, nosebleeds, pregnancy symptoms, shortness of breath, skin rash, sleeping problems, sore throat, swelling of feet/legs, swollen lymph glands, thirst - excessive , urination - excessive , urination changes/pain, urine leakage, vision changes, and voice change.    Vital Signs:  Patient profile:   40 year old female Height:      69 inches Weight:      164 pounds BMI:     24.31 BSA:     1.90 Pulse rate:   76 / minute Pulse rhythm:   regular BP sitting:   110 / 64  (left arm) Cuff size:   regular  Vitals Entered By: Hope Pigeon CMA (December 11, 2010 2:01 PM)  Physical Exam  General:  Well developed, well nourished, no acute distress. Head:  Normocephalic and atraumatic. Eyes:  PERRLA, no icterus. Mouth:  No deformity  or lesions. Lungs:  Clear throughout to auscultation. Heart:  Regular  rate and rhythm; no murmurs, rubs,  or bruits. Abdomen:  soft and nontender with good bowel sounds ;surgical incisions well-healed. Nodular granulation tissue at the ostomy site as well as irritated skin. No obvious infection Pulses:  Normal pulses noted. Extremities:  no edema Neurologic:  Alert and  oriented x4. Skin:  Intact without significant lesions or rashes, aside from peristomal area. Psych:  Alert and cooperative. Depressed mood and affect   Impression & Recommendations:  Problem # 1:  OTHER COMPLICATION OF COLOSTOMY OR ENTEROSTOMY (ICD-569.69) problems around the ostomy which resulted burning pain. I spoke with her surgeon Dr. [name] who describes a similar appearance of the ostomy during his recent examination. Understandably, he would like to avoid revision of possible but would be willing if necessary.  Plan: #1. Clean the peristomal area well #2. Caloseptine ointment b.i.d. Samples given #3. An appointment made for her to see her ostomy nurse, Cena Benton, again #4. For persisting problems despite the above, contact Dr. [name]  Problem # 2:  CROHN'S DISEASE, [place] (ICD-555.2) severe disease requiring surgery as described. No evidence for active disease of the residual colon or [place] ileum.  Plan: #1. CBC, C-reactive protein, and comprehensive metabolic panel today #2. Continue close observation. #3. Routine followup in 6 months. Probably plan relook colonoscopy in one to 2 years  Other Orders: TLB-CRP-High Sensitivity (C-Reactive Protein) (86140-FCRP) TLB-CBC Platelet - w/Differential (85025-CBCD)  Patient Instructions: 1)  Labs ordered for patient to have drawn today. 2)  A call to Ostomy nurse placed attn:  Juliann Pulse to set up an appt. for you to have your Ostomy examined.  12/12/10 1:30 pm   3)  Please schedule a follow-up appointment in 6 months. 4)  Copy sent to : Idelia Salm, PA and  Cena Benton (ostomy nurse), Jeanella Anton  M.D. 5)  The medication list was reviewed and reconciled.  All changed / newly prescribed medications were explained.  A complete medication list was provided to the patient / caregiver.

## 2011-01-30 NOTE — Progress Notes (Signed)
  Phone Note Other Incoming   Request: Send information Summary of Call: Request for records received from [facility]. Request forwarded to [facility].  Initial to present.

## 2011-02-26 ENCOUNTER — Encounter: Payer: Self-pay | Admitting: Ophthalmology

## 2011-04-07 LAB — BASIC METABOLIC PANEL
CO2: 30 mEq/L (ref 19–32)
Chloride: 104 mEq/L (ref 96–112)
Glucose, Bld: 104 mg/dL — ABNORMAL HIGH (ref 70–99)
Potassium: 3.6 mEq/L (ref 3.5–5.1)
Sodium: 139 mEq/L (ref 135–145)

## 2011-04-07 LAB — DIFFERENTIAL
Basophils Absolute: 0 10*3/uL (ref 0.0–0.1)
Basophils Relative: 0 % (ref 0–1)
Eosinophils Relative: 2 % (ref 0–5)
Lymphocytes Relative: 25 % (ref 12–46)
Monocytes Absolute: 0.4 10*3/uL (ref 0.1–1.0)

## 2011-04-07 LAB — CBC
HCT: 25.1 % — ABNORMAL LOW (ref 36.0–46.0)
Hemoglobin: 9 g/dL — ABNORMAL LOW (ref 12.0–15.0)
MCHC: 35.9 g/dL (ref 30.0–36.0)
RDW: 15.4 % (ref 11.5–15.5)

## 2011-04-07 LAB — WOUND CULTURE

## 2011-04-08 LAB — CBC
HCT: 29.8 % — ABNORMAL LOW (ref 36.0–46.0)
Hemoglobin: 10.4 g/dL — ABNORMAL LOW (ref 12.0–15.0)
Hemoglobin: 10.9 g/dL — ABNORMAL LOW (ref 12.0–15.0)
Hemoglobin: 11.8 g/dL — ABNORMAL LOW (ref 12.0–15.0)
MCHC: 34.2 g/dL (ref 30.0–36.0)
MCHC: 39.5 g/dL — ABNORMAL HIGH (ref 30.0–36.0)
MCV: 104.4 fL — ABNORMAL HIGH (ref 78.0–100.0)
RBC: 2.86 MIL/uL — ABNORMAL LOW (ref 3.87–5.11)
RBC: 3.4 MIL/uL — ABNORMAL LOW (ref 3.87–5.11)
RDW: 15.4 % (ref 11.5–15.5)
WBC: 10.6 10*3/uL — ABNORMAL HIGH (ref 4.0–10.5)
WBC: 14 10*3/uL — ABNORMAL HIGH (ref 4.0–10.5)

## 2011-04-08 LAB — CULTURE, ROUTINE-ABSCESS

## 2011-04-08 LAB — ANAEROBIC CULTURE

## 2011-04-08 LAB — BASIC METABOLIC PANEL
CO2: 28 mEq/L (ref 19–32)
Calcium: 8.4 mg/dL (ref 8.4–10.5)
GFR calc Af Amer: >60 mL/min (ref >60)
GFR calc non Af Amer: >60 mL/min (ref >60)
Sodium: 138 mEq/L (ref 135–145)

## 2011-04-09 LAB — CBC
HCT: 33.7 % — ABNORMAL LOW (ref 36.0–46.0)
Hemoglobin: 11.2 g/dL — ABNORMAL LOW (ref 12.0–15.0)
Hemoglobin: 12.4 g/dL (ref 12.0–15.0)
Hemoglobin: 13 g/dL (ref 12.0–15.0)
Hemoglobin: 14.9 g/dL (ref 12.0–15.0)
MCHC: 33.2 g/dL (ref 30.0–36.0)
MCHC: 33.3 g/dL (ref 30.0–36.0)
MCHC: 35 g/dL (ref 30.0–36.0)
MCHC: 35.3 g/dL (ref 30.0–36.0)
MCV: 93.1 fL (ref 78.0–100.0)
MCV: 95.8 fL (ref 78.0–100.0)
MCV: 96 fL (ref 78.0–100.0)
Platelets: 388 10*3/uL (ref 150–400)
RBC: 3.51 MIL/uL — ABNORMAL LOW (ref 3.87–5.11)
RBC: 3.8 MIL/uL — ABNORMAL LOW (ref 3.87–5.11)
RBC: 4.43 MIL/uL (ref 3.87–5.11)
RDW: 16.3 % — ABNORMAL HIGH (ref 11.5–15.5)
RDW: 16.9 % — ABNORMAL HIGH (ref 11.5–15.5)
RDW: 17 % — ABNORMAL HIGH (ref 11.5–15.5)
WBC: 13.7 10*3/uL — ABNORMAL HIGH (ref 4.0–10.5)
WBC: 9.9 10*3/uL (ref 4.0–10.5)

## 2011-04-09 LAB — COMPREHENSIVE METABOLIC PANEL
CO2: 25 mEq/L (ref 19–32)
Calcium: 9.2 mg/dL (ref 8.4–10.5)
Creatinine, Ser: 0.46 mg/dL (ref 0.4–1.2)
GFR calc Af Amer: >60 mL/min (ref >60)
GFR calc non Af Amer: >60 mL/min (ref >60)
Glucose, Bld: 125 mg/dL — ABNORMAL HIGH (ref 70–99)

## 2011-04-09 LAB — BASIC METABOLIC PANEL
BUN: 8 mg/dL (ref 6–23)
CO2: 28 mEq/L (ref 19–32)
CO2: 29 mEq/L (ref 19–32)
Calcium: 9 mg/dL (ref 8.4–10.5)
Calcium: 9 mg/dL (ref 8.4–10.5)
Calcium: 9.1 mg/dL (ref 8.4–10.5)
Chloride: 103 mEq/L (ref 96–112)
Creatinine, Ser: 0.53 mg/dL (ref 0.4–1.2)
Creatinine, Ser: 0.55 mg/dL (ref 0.4–1.2)
Creatinine, Ser: 0.64 mg/dL (ref 0.4–1.2)
GFR calc Af Amer: >60 mL/min (ref >60)
GFR calc Af Amer: >60 mL/min (ref >60)
GFR calc non Af Amer: >60 mL/min (ref >60)
Glucose, Bld: 80 mg/dL (ref 70–99)
Glucose, Bld: 93 mg/dL (ref 70–99)
Glucose, Bld: 98 mg/dL (ref 70–99)
Potassium: 3.5 mEq/L (ref 3.5–5.1)
Sodium: 139 mEq/L (ref 135–145)
Sodium: 139 mEq/L (ref 135–145)

## 2011-04-09 LAB — URINALYSIS, ROUTINE W REFLEX MICROSCOPIC
Bilirubin Urine: NEGATIVE
Ketones, ur: NEGATIVE mg/dL
Nitrite: NEGATIVE
Urobilinogen, UA: 1 mg/dL (ref 0.0–1.0)

## 2011-04-09 LAB — CULTURE, BLOOD (ROUTINE X 2): Culture: NO GROWTH

## 2011-04-09 LAB — ANAEROBIC CULTURE

## 2011-04-09 LAB — CULTURE, ROUTINE-ABSCESS: Culture: NO GROWTH

## 2011-04-09 LAB — GC/CHLAMYDIA PROBE AMP, URINE: Chlamydia, Swab/Urine, PCR: NEGATIVE

## 2011-04-14 LAB — COMPREHENSIVE METABOLIC PANEL
CO2: 23 mEq/L (ref 19–32)
Calcium: 9.2 mg/dL (ref 8.4–10.5)
Creatinine, Ser: 0.57 mg/dL (ref 0.4–1.2)
GFR calc Af Amer: >60 mL/min (ref >60)
GFR calc non Af Amer: >60 mL/min (ref >60)
Glucose, Bld: 75 mg/dL (ref 70–99)
Sodium: 137 mEq/L (ref 135–145)
Total Protein: 7.4 g/dL (ref 6.0–8.3)

## 2011-04-14 LAB — CBC
Hemoglobin: 13.5 g/dL (ref 12.0–15.0)
MCHC: 33.5 g/dL (ref 30.0–36.0)
MCV: 95.8 fL (ref 78.0–100.0)
RBC: 4.2 MIL/uL (ref 3.87–5.11)
RDW: 14.1 % (ref 11.5–15.5)

## 2011-04-14 LAB — URINALYSIS, ROUTINE W REFLEX MICROSCOPIC
Nitrite: NEGATIVE
Specific Gravity, Urine: 1.031 — ABNORMAL HIGH (ref 1.005–1.030)
Urobilinogen, UA: 1 mg/dL (ref 0.0–1.0)
pH: 5.5 (ref 5.0–8.0)

## 2011-04-14 LAB — PREGNANCY, URINE: Preg Test, Ur: NEGATIVE

## 2011-04-14 LAB — DIFFERENTIAL
Lymphocytes Relative: 18 % (ref 12–46)
Lymphs Abs: 1.4 10*3/uL (ref 0.7–4.0)
Monocytes Relative: 11 % (ref 3–12)
Neutro Abs: 5.5 10*3/uL (ref 1.7–7.7)
Neutrophils Relative %: 70 % (ref 43–77)

## 2011-04-14 LAB — LIPASE, BLOOD: Lipase: 18 U/L (ref 11–59)

## 2011-04-26 ENCOUNTER — Encounter (INDEPENDENT_AMBULATORY_CARE_PROVIDER_SITE_OTHER): Payer: Self-pay | Admitting: General Surgery

## 2011-05-13 NOTE — Op Note (Signed)
NAMEADDLEY, BALLINGER NO.:  [id number]   MEDICAL RECORD NO.:  [id number]          PATIENT TYPE:  INP   LOCATION:  5149                         FACILITY:  [facility]   PHYSICIAN:  [name]. Weatherly, M.D.DATE OF BIRTH:  [birth year]   DATE OF PROCEDURE:  05/24/2009  DATE OF DISCHARGE:                               OPERATIVE REPORT   PREOPERATIVE DIAGNOSES:  1. Left pelvic abscess secondary to Crohn's sigmoid colitis.  2. 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  3. Recent ischiorectal abscess, drained.   HISTORY:  [name] is a 40 year old female who I first met  approximately 6 weeks ago when I was on the doctor of the week Surgical  Service and she was admitted to the Teaching Service with a kind of  combination of problems.  Supposedly she has been extremely under stress  with family problems, inability to keep permanent employment.  She has  had known Crohn disease for some time and Dr. Scarlette Shorts is her  gastroenterologist and when she was seen in the [facility], I think  that she mentioned that kind of a suicide-type gesture and she was  admitted with suicide precautions, but was found to have a large history  of rectal abscess.  Procto shows significant Crohn's in the rectum, but  I drained the ischiorectal abscess and it appears to have healed up  fairly promptly.  She had a small infection or abscess coming around the  left tube and ovary that has persisted.  I saw her in the office this  past week for followup of the abscess, but she was complaining of more  abdominal pain, the pain kind of more on the right, and we obtained a  repeat CT and also laboratory studies.  Her white count was 17,000 and  the abscess that has been present previously was much larger, all kind  of in the left pelvic area and then there is marked inflammation around  the sigmoid colon with hair and little pockets and et Ronney Asters.  She had  an  appointment with Dr. [name] on Wednesday and I talked to him and  discussed, I thought that she really needs resection of the involved  area of Crohn's or colostomy and he was in agreement, and we had  discussed that previously when she was seen with the ischiorectal  abscess and she is more exceptive to the procedure at this time.  I  called her this morning and she was in agreement to go ahead and be  admitted and added to the Urgent Service.  She had gotten some stool  from the CT prep that was done on Monday and on repeat laboratory  studies today.  She was started on Zosyn, permission obtained for the  surgery and added to the OR schedule for this afternoon.  Preoperatively, she was receiving the Zosyn when she came down and the  permit had been signed.  When discussing with her, she is familiar with  what a colostomy is and interested in that.  She has got 3 family  members, her  father and a sister are all Crohn's patient.   The patient was taken back to the operative suite.  Induction of general  anesthesia, endotracheal tube, NG to the stomach, and then a Foley  catheter was inserted sterilely, and then the abdomen was prepped with  Betadine solution and draped in sterile manner.  A lower midline  incision was made and upon opening into the peritoneal cavity, the small  bowel is very dilated where she is newly got an obstruction of the ileal  segment and then the sigmoid colon is very thickened, edematous, not the  involved whole colon but primarily from the descending colon right on  down to the rectal junction.  The abscess or inflammatory process on the  left, I have elected to go ahead and resect the terminal ilium first,  did not put her back together initially.  Dr. Barry Dienes scrubbed in after  about 45 minutes after I started and the patient's appendix was still  present.  I went ahead and did an incidental appendectomy.  The appendix  was kind of running lateral, not truly  retrocecal, but we got that out.  The gallbladder was distended, but not acutely inflamed and I squeezed  the gallbladder to decompress the bile.  We then went ahead and started  freeing up the sigmoid colon where it was just adherent with this  chronic inflammation and the actual abscess, is located really pretty  somewhat like a tuboovarian abscess, but there is no evidence of any  inflammatory process on the right side and the colon looks like there is  a communication from the sigmoid colon that is going to this and I think  that actually goes retroperitoneum, and I would not be surprised if it  not communicate to the ischiorectal abscess area.  I did not track that  down at this time, but transected the sigmoid colon really right at the  dependent portion of the descending colon with a GIA and then working  very close to the mesentery on the sigmoid colon, divided the mesentery  which was very thickened and edematous with callus and the pedicles were  tied with 2-0 silk, some of the actual little areas were divided with a  LigaSure and then I did not actually dissect that through  retroperitoneal, looking for the left ureter where we kept very high on  the mesentery.  I then went down into the pelvic area and then elected  to transect the distal sigmoid probably about 2 cm or really right at  the rectal-peritoneal junction and this was done with the Contour double  stapler.  I put up suture on the distal sigmoid segment for  identification for the pathologist and also had a suture at the terminal  ileal segment of the small bowel.  We broke up the abscess and it is  kind of extraperitoneal portion and the left tube was all edematous and  swollen and because of the abscess, I think it is not related to a PID  issue, but the colon, I elected to go ahead and remove the left tube.  The patient said she desired that I do that and then removed a portion  of the ovary, I did not really do a  complete ovariectomy like if you  were doing for a malignancy but did sutures of 2-0 Vicryl for  hemostasis.  Later, we put a 19-Blake actually in the top of the abscess  cavity, most of this in the  peritoneal cavity and then this was brought  out through a left lateral incision.  We elected to put the terminal  ileum to the cecum with a side-to-side staple and then little enterotomy  was closed in 2 layers with a 3-0 Vicryl and 3-0 Lembert sutures and it  is lying comfortable.  The mesentery defect closed it with interrupted  sutures of 2-0 silk and then prepared the colostomy, it is right at the  umbilical level going through the lateral edge of the rectus and I made  a 2-finger open, kind of took the fatty tissue off the inferior portion  of the colon, so it would come up to skin level, but tried anchor it  through a kind of a snug incision, so hopefully she will get a hernia  because I expected the colostomy would be permanent.  The few sutures of  3-0 silk were used to attach the peritoneum to the descending colon and  then after the midline incision had been closed, matured the colostomy  with 3-0 Vicryl.  The sponge count was correct.  The omentum was brought  down over the small bowel in the area where the ileocecal anastomosis  was performed and then the skin was closed with staples.  The patient  tolerated the procedure nicely.  The drain had been sutured in place,  and we will keep her on the Zosyn.  I did do aerobic and anaerobic  cultures and we will start her on Lovenox tomorrow.  I have placed the  nasogastric tube, it is in the stomach and since she has got an  ileocecal anastomosis, a colostomy will kind of be a little slow and  remove the NG since she was very dilated preoperatively because of the  near obstruction of the terminal ileum.  The patient tolerated the  procedure nicely.  We will use PCA, morphine for postoperative pain  control and check laboratory studies in  the a.m.      [name]. [name], M.D.  Electronically Signed     WJW/MEDQ  D:  05/24/2009  T:  05/25/2009  Job:  [id number]   cc:   Docia Chuck. [name], MD  [facility]

## 2011-05-13 NOTE — Discharge Summary (Signed)
[name], [name]               ACCOUNT NO.:  [id number]   MEDICAL RECORD NO.:  [id number]          PATIENT TYPE:  INP   LOCATION:  5149                         FACILITY:  [facility]   PHYSICIAN:  Marland Kitchen T. Hoxworth, M.D.DATE OF BIRTH:  [birth year]   DATE OF ADMISSION:  05/24/2009  DATE OF DISCHARGE:  06/02/2009                               DISCHARGE SUMMARY   DISCHARGING PHYSICIAN:  Either Odis Hollingshead, MD, or Isabel Caprice.  Hassell Done, MD   PRIMARY SURGEON:  [name]. [name], MD.   GASTROENTEROLOGIST:  Docia Chuck. [name], MD   PRIMARY PHYSICIAN:  [place].   PROCEDURES:  Exploratory laparotomy with resection of terminal ileum  with ileocecal anastomosis and sigmoid colectomy with colostomy by Dr.  [name] on May 24, 2009.   CONSULTANTS:  None.   REASON FOR ADMISSION:  [name] is a 40 year old white female with a  history of Crohn disease with recurrent perirectal abscesses as well as  intraabdominal abscesses.  She was having worsening pain several days  prior to admission and went to see Dr. [name] in the office and at  that time, had a CT scan ordered which showed a continued intraabdominal  abscess in her left lower quadrant.  Because of this, Dr. [name] felt  that the patient needed to go ahead and have surgical intervention.  Therefore, she was sent over to the hospital where she was admitted.  Please see admitting history and physical for further details.   ADMITTING DIAGNOSES:  1. Crohn disease associated with intraabdominal and perirectal      abscesses.  2. Leukocytosis.   HOSPITAL COURSE:  At this time, the patient was admitted and placed on  IV fluids, as well as Zosyn.  Later that night, the patient was taken to  the operating room where laparotomy with terminal ileum resection with  ileocecal anastomosis and sigmoid colectomy with colostomy was  performed.  The patient tolerated this procedure well.  On the first  postop day, the patient  was incredibly anxious, had an NG tube present  and no air or flatus was noted in her ostomy.  At this time, she was  continued on bowel rest with an NG tube.  Over the next several days,  the patient continued with postoperative ileus.  Otherwise, she was  doing fairly well but having quite a bit of pain and used her PCA quite  a bit.  On postoperative day 6, the patient was beginning to pass flatus  and therefore her NG tube was clamped.  Toradol was added the prior day  which helped her pain quite a bit.  She began using her PCA much less at  this time.  The following day, the patient tolerated her NG clamp and it  was discontinued and the patient was started on diet.  Over the next  several days, her diet was advanced as tolerated, and by postoperative  day 8, she was tolerating full liquids and advanced to a regular diet.   Wound ostomy care nurse was consulted.  They taught the patient about  her pouch.  Home health  was also ordered for ostomy care at home.  After  surgery, the patient did have a JP drain and at this point at  postoperative day 8, it was still in place and only putting  approximately 25-35 mL of serous fluid out.  This may be discharged  prior to discharge home.  However, I will leave that up to Dr. Excell Seltzer.  It is anticipated if the patient tolerates her regular diet that she  will be able for discharge home on postoperative day 9, which will be  tomorrow, June 02, 2009.  Also, of note, her staples were discontinued on  postoperative day 7, and she did have some slight opening of her wound  at the middle of her incision.  She just has minimal serous drainage and  Steri-Strips were placed over this.   DISCHARGE DIAGNOSES:  1. Crohn disease with recurrent intraabdominal and perirectal      abscesses.  2. Status post laparotomy with terminal ileum resection with ileocecal      anastomosis and sigmoid colectomy with ostomy.  3. Postoperative ileus which is  resolved.  4. Leukocytosis which resolved fairly quickly postoperatively.   DISCHARGE MEDICATIONS:  The patient may resume home medications  including,  1. Entocort 3 mg 3 times a day.  2. Celexa 5 mg daily.  3. Trazodone at bedtime.  4. Caltrate D 600 mg daily.  5. Fish oil daily.  6. Potassium gluconate daily.  7. B12 daily.   She is informed to stop her Darvocet 1-2 tablets every 4 hours as needed  for pain and she was given a new prescription for Percocet 5/325 one to  two p.o. q.4 h. p.r.n. pain.   DISCHARGE INSTRUCTIONS:  The patient may resume a regular diet.  She may  increase her activity slowly and walk up steps.  She may shower,  however, she is not to bathe for at least the next 2 weeks.  She is not  to lift anything greater than approximately 10-15 pounds for the next 6  weeks and she is not to drive for the next 1-2 weeks while taking  narcotic pain medicine.  She does have home health ordered for ostomy  care and for possibly JP drain care if this is not discontinued prior to  discharge home.  Otherwise, she is to return to see Dr. [name] in the  office within the next 10-14 days.  She is to call if she has worsening  pain, does not control with her pain medicine, or fever greater than  101.5 or any purulent drainage that appears to be coming from her  midline wound.      Glenda Cea, PA      [name]. Hoxworth, M.D.  Electronically Signed    KEO/MEDQ  D:  06/01/2009  T:  06/02/2009  Job:  [id number]   cc:   [name]. [name], M.D.  Docia Chuck. [name], MD  [facility]
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
## 2011-05-13 NOTE — Consult Note (Signed)
NAMEMARVENA, [name] NO.:  [id number]   MEDICAL RECORD NO.:  [id number]          PATIENT TYPE:  INP   LOCATION:  0383                         FACILITY:  [facility]   PHYSICIAN:  [name], M.D.  DATE OF BIRTH:  [birth year]   DATE OF CONSULTATION:  04/05/2009  DATE OF DISCHARGE:                                 CONSULTATION   REQUESTING PHYSICIAN:  Glenda Pellegrini, MD.   REASON FOR CONSULTATION:  Depression.   HISTORY OF PRESENT ILLNESS:  [name] is a 40 year old female  admitted to the [facility] on April 03, 2009, for treatment of a  complication of her Crohn's disease.   She developed a perirectal abscess.   She has experienced greater than 2 weeks of depressed mood, low energy,  difficulty concentrating, difficulty with attention and insomnia.  She  has had intermittent thoughts of harming herself without any intent.  She has been able to dismiss these thoughts and has strong defense  against suicide.   Stresses include a series of lost jobs because of her Crohn's disease,  as well as continuing to grieve over her husband leaving her 2 years  ago.   She has intact orientation and memory function.  She does not have any  hallucinations or delusions.  She is socially appropriate and  cooperative.   PAST PSYCHIATRIC HISTORY:  No history of increased energy or decreased  need for sleep.  No prior history of suicide attempts.  She has never  undergone any psychotropic care or counseling.   FAMILY PSYCHIATRIC HISTORY:  None known.   SOCIAL HISTORY:  After her husband left 2 years ago, [name] began to  drink beer on a regular basis.  She then backed off and has not had any  beer since January or another form of alcohol.   She does not use illegal drugs.   Her last place of employment was [employer].  She is now medically  disabled and unemployed.   She has been living in an apartment with her cousin.  She does not have  any  children.   Please see the consultation report on April 04, 2009, by Ebony Hail L.  Lissa Merlin, nurse practitioner and [name]. [name], M.D., regarding her  living situation.   PAST MEDICAL HISTORY:  Crohn's disease, status post I and D of rectal  abscess.   MEDICATIONS:  The MAR is reviewed.   ALLERGIES:  NO KNOWN DRUG ALLERGIES.   LABORATORY DATA:  Sodium 141, BUN 8, creatinine 0.64, glucose 93.  WBC  13.7, hemoglobin 14.0, platelet count 388.  Urine pregnancy negative.  On April 03, 2009, SGOT 16, SGPT 17.   REVIEW OF SYSTEMS:  Noncontributory.   Vital signs:  Temperature 97.0, pulse 69, respiratory rate 18, blood  pressure 108/71.  O2 saturation on room air 97%.   MENTAL STATUS EXAM:  [name] is alert.  She has intact orientation to  all spheres.  Her memory is intact to immediate, recent and remote.  Her  fund of knowledge and intelligence are normal.  Her speech involves  normal rate  and prosody without dysarthria.  Affect is constricted.  Mood is depressed.  Thought process is logical, coherent and goal  directed.  No looseness of associations.  Thought content; no thoughts  of harming herself or others.  No delusions or hallucinations.  Insight  is intact.  Judgment is intact.   ASSESSMENT:  Axis I:  293.83, mood disorder, not otherwise specified  (idiopathic and general medical factors), depressed.  296.23, major  depressive disorder.  Axis II:  Deferred.  Axis III:  See past medical history.  Axis IV:  General medical, primary support group.  Axis V:  Global Assessment of Functioning 55.   [name] is not at risk to harm herself or others.  She agrees to call  emergency services immediately for any thoughts of harming herself,  thought of harming others or distress.   The patient requested that her mother be in the room in order to  facilitate support and education.   The undersigned provided ego supportive psychotherapy and education.   The indications,  alternatives and adverse effects of Celexa, as well as  trazodone were discussed with the patient.  She understands and wants to  proceed as below.  She agrees to not drive if drowsy.   RECOMMENDATIONS:  1. Would start Celexa 5 mg in elixir form p.o. q.a.m., would then      increase by 5 mg every 3 days as tolerated to the target dosage of      20 mg p.o. q.a.m.  2. Would start trazodone at 25 mg nightly and then increase by 25 mg      per evening as needed and as tolerated until a normal sleep pattern      is achieved, not to exceed 200 mg nightly.  3. Would have the social worker schedule outpatient psychiatric follow      up along with counseling for [name].  Would also ask the social      worker to address the social situation that was identified in Dr.      [place]her[place] consult.      [name], M.D.  Electronically Signed     JW/MEDQ  D:  04/05/2009  T:  04/05/2009  Job:  [id number]

## 2011-05-13 NOTE — Discharge Summary (Signed)
NAMEWINNELL, [name] NO.:  [id number]   MEDICAL RECORD NO.:  [id number]          PATIENT TYPE:  INP   LOCATION:  7510                         FACILITY:  [facility]   PHYSICIAN:  Felicity Pellegrini, MD     DATE OF BIRTH:  1971-12-16   DATE OF ADMISSION:  04/03/2009  DATE OF DISCHARGE:  04/07/2009                               DISCHARGE SUMMARY   ATTENDING PHYSICIAN:  Felicity Pellegrini, MD   DISCHARGE DIAGNOSES:  1. Suicidal ideation and depression.  2. Perirectal abscess.  3. Crohn's disease.   DISCHARGE MEDICATIONS:  1. Cipro 500 mg p.o. b.i.d. x10 days.  2. Flagyl 500 mg p.o. t.i.d. x10 days.  3. Prednisone 10 mg p.o. daily.  4. Vitamin B12 250 mcg p.o. daily.  5. Entocort 3 mg by mouth t.i.d.  6. Celexa 5 mg p.o. q.a.m., and dose to be increased at 5 mg every 3      days as tolerated for a target dose of 20 mg p.o. q.a.m.  7. Trazodone 25 mg p.o. at bedtime, and this could be increased by 25      mg per evening as needed for normal sleep pattern, not to exceed      200 mg p.o. at bedtime.  8. Tramadol 50 mg p.o. q.6 h. p.r.n. pain.  9. Darvocet-N 100 1-2 tablets p.o. q.6 h. p.r.n. pain.  10.Caltrate plus vitamin D p.o. daily.  11.Facial 1000 mg p.o. daily.  12.Colace 100 mg p.o. b.i.d.   DISPOSITION AND FOLLOWUP:  1. The patient has a followup appointment with Dr. [name] scheduled      on April 09, 2009, at 11:30 a.m.  2. She has a followup appointment with Dr. Caryn Section at he outpatient      clinic on April 17, 2009, at 2:00 p.m.  She also has an appointment      being scheduled by social work to follow up with outpatient      psychiatry.  She was also given phone number to [facility] to      call and schedule an appointment for counseling.   During the appointment with Dr. Caryn Section in the outpatient clinic, there  are few things that need to be addressed.  1. Depression/suicidal ideation.  As mentioned above, Dr. Rhona Raider      recommended titrating  medications up to a target.  This should be      questioned as to whether the patient is still taking her medicines,      how her depression is doing, how her situation at home is doing,      and how her sleep pattern is.  She should also have an appointment      set up with Katy Fitch, social worker, to make sure the      patient has appropriate resources and any help/guidance with      disability forms.  The patient should be questioned as to who she      is seeing in outpatient psychiatry and records requested to make      sure that we are up-to-date on their  recommendations.  2. Perirectal abscess.  The patient has follow up with Dr. [name].      They have made recommendations in regards to wound care.  They will      be seeing her shortly after discharge.  We should request records      from [facility] when she is seen in the outpatient      clinic by Dr. Caryn Section.  Furthermore, she was sent home on p.o.      Cipro, Flagyl for an extra 10 days, during the outpatient clinic      visit, they should inquire as to completion of antibiotics and any      signs of infection.  3. Crohn's disease.  The patient was maintained on her prednisone and      insulin port.  The patient's gastroenterologist Dr. [name] to see if      there are any further recommendations regarding Crohn's disease.   PROCEDURE PERFORMED:  The patient had a surgical I and D by Dr.  [name] on April 05, 2009, for the perirectal abscess under general  anesthesia.   CONSULTATION:  [facility], Dr. [name] was consulted.   BRIEF ADMITTING HISTORY AND PHYSICAL:  [name] is a 40 year old woman  with Crohn's disease, on treatment under Dr. [name], on long-term  steroids, she is seen today by Dr. [name] for rectal pain, was found to  have a developing abscess, waiting appointment with the surgeons.  She  comes to our clinic to talk about her low mood.  She is currently  unemployed, waiting  disability benefit and is living with a female  partner.  She has been having some differences with her female partner,  who is now asked her to leave.  She is very tearful and stressed about  the possibility of having no vertigo.  She mentions having had a  suicidal ideation as late as early this morning, [place].   She was seen earlier the day prior to admission by Dr. [name].  She had  extensive disease of the ileum as well as the left colon.  She has had a  history of perirectal abscess requiring surgical drainage.  Her physical  problems were complicated by anxiety/depression as well as multiple  social issues including unemployment, lack of insurance, housing issues,  relationship problems.  She was placed on Entocort (multiple samples  provided to assure compliance).  She continues to complain of pain.  Thus, a small bowel series was performed on March 14, 2009.  This  revealed a long segment of distal and terminal ileum narrowing with  partial obstruction.  A surgical referral was made to Dr. Excell Seltzer who is to see her this week.  Having a new complaint of rectal  pain over the past 2 weeks, this is fairly constant though worse with  defecation.  A minor bleeding has been present.  She denies any fever.  She thinks she may have an abscess present.  She continues with  intermittent right mid abdominal pain and left lower quadrant pain.  She  continues to smoke though now down to one-half pack per day since she is  interested in quitting.   PHYSICAL EXAMINATION:  VITAL SIGNS:  Temperature 98.1, pulse 78, blood  pressure 122/83.  GENERAL:  She is alert and well developed, in no acute distress.  HEENT:  Head is normocephalic and atraumatic.  Eyes, pupils equally  round and react to  light and accommodation.  Extraocular movements are  intact.  Sclera is anicteric.  Nose without any deformity, no discharge,  no lesions.  Mouth, no deformity or lesions.   Dentition is normal.  Mild  hirsutism is present.  NECK:  Supple with no masses or thyromegaly.  LUNGS:  Clear throughout.  HEART:  Regular rate and rhythm.  No murmurs, gallops, or rubs.  No  bruits.  ABDOMEN:  Soft and nondistended without masses or hernia.  Mild  tenderness in the right mid abdomen to palpation as well as moderate  tenderness in the left lower quadrant to palpation.  Normal bowel  sounds.  No organomegaly.  RECTAL:  There is scarring over the left perirectal buttock.  There is a  firm, nontender, nonfluctuant, nonindurated area of the left perirectal  buttock measuring approximately 3 cm.  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  MUSCULOSKELETAL:  Symmetric with no gross deformities.  Normal posture.  Pulses normal throughout.  EXTREMITIES:  No cyanosis, clubbing, or edema.  NEUROLOGIC:  She is awake, alert, and oriented x4.  Grossly normal  neurologic exam.  SKIN:  Intact without significant lesions or rashes.  CERVICAL:  There is no cervical lymphadenopathy.  There are no inguinal  lymphadenopathy either.  PSYCH:  She is alert and cooperative.  Her mood is depressed and  anxious.   HOSPITAL COURSE:  1. Depression with suicidal ideation.  Again, this is likely secondary      to her psychosocial issues.  She is currently unemployed, applying      for disability.  Furthermore, she has issues with person whom she      is living with, this is a female person and apparently her cousin.      Social work has been consulted for further assistance with this.      She was admitted secondary to her suicidal ideation, placed on      suicide precautions with a sitter.  We will also asked Dr.      Rhona Raider from Psychiatry to evaluate for further recommendations      and question as to whether the patient needed [facility].  He felt this was not necessary given her social      circumstances seemed to be the main part of the problem and this      was  going to be resolved by the patient moving in with her father      upon discharge.  He did recommend starting her on Celexa and      trazodone with titration to go dosages listed above.  The patient      felt much better with optimistic and has appropriate followup in      place.  Family is very supportive and has been present most days of      hospitalization upon discharge.  2. Perirectal abscess.  The patient had been placed on Cipro and      Flagyl in the outpatient clinic prior to admission.  Dr. [name]      was consulted in [facility] for further evaluation.      She had a CT scan done of the pelvis that demonstrated;      a.     7.05 x 3.5 cm left perirectal abscess.      b.     A 6.5 cm mostly loculated fluid collection within the       sigmoid mesentery concerning for  abscess.  3. Significant decrease in mucosal thickening of the colon.  Upon      results of this, Dr. [name] took the patient to the OR for      drainage of large ischiorectal abscess under general anesthesia.      The patient tolerated surgery well without any complications.  She      was doing much better in the postop period.  Wound Care was      recommended for surgery and they felt that she was okay for      discharge to continue Cipro and Flagyl for a total of 2 weeks.  4. Crohn's disease.  The patient was maintained on her prednisone and      Entocort.  This was discontinued throughout the hospitalization.      The patient is to follow up with the outpatient clinic and Dr.      [name] is to be contacted.  There is note that she should be      tapering off of prednisone, but I do not see a recommendation as      the speed to which to do this at.  Therefore, upon return to      clinic, Dr. [name] needs to be contacted to determine when she is to      come off her prednisone.   LABORATORY DATA ON DISCHARGE:  Sodium 139, potassium 3.5, chloride 103,  bicarb 28, glucose 98, BUN 6, creatinine  0.64, calcium 9.0, white count  9.4, hemoglobin 11.2, platelets 418,000.      Alphia Moh, MD  Electronically Signed      Felicity Pellegrini, MD  Electronically Signed    MA/MEDQ  D:  04/07/2009  T:  04/08/2009  Job:  [id number]   cc:   [name]. [name], M.D.  Dawna Part, MD  Felizardo Hoffmann, M.D.  Docia Chuck. [name], MD

## 2011-05-13 NOTE — H&P (Signed)
NAMEROSAMUND, NYLAND NO.:  [id number]   MEDICAL RECORD NO.:  [id number]          PATIENT TYPE:  INP   LOCATION:  5149                         FACILITY:  [facility]   PHYSICIAN:  [name]. Weatherly, M.D.DATE OF BIRTH:  [birth year]   DATE OF ADMISSION:  05/24/2009  DATE OF DISCHARGE:                              HISTORY & PHYSICAL   TIME OF ADMISSION:  11:10 a.m.   ADMITTING PHYSICIAN:  [name]. [name], MD   GASTROENTEROLOGIST:  Docia Chuck. [name], MD.   PRIMARY SURGEON:  [name]. [name], MD   I believe the patient sees [facility] as a primary care  physician.   CHIEF COMPLAINT:  Abdominal pain with a history of Crohn disease.   HISTORY OF PRESENT ILLNESS:  [name] is a 40 year old white female with  a history of Crohn disease with recurrent perirectal abscess as well as  intraabdominal abscess.  This is secondary to her Crohn who has been  followed by Dr. [name] as an outpatient after having a perirectal I&D  back in April.  Since then she has been treated as an outpatient  requiring Cipro and Flagyl at times and following her intraabdominal  abscesses.  All of this was improving and on her last office visit Dr.  [name] noted a dramatic improvement in her perirectal abscess wound  and he had at that time ordered a repeat CT scan for 2 weeks to reassess  this area to make sure everything was continuing to improve, that was on  May 07, 2009.  The patient then began to develop increasing left lower  quadrant abdominal pain this past Saturday.  She originally thought that  she was going to start her menstrual cycle, however, the pain began to  worsen and felt that it was becoming an issue.  Her CT scan, that was  previously mentioned, was set up for this past Monday, May 24.  She did  go to that and the CT scan showed a multiloculated abscess collection in  her sigmoid mesocolon with persistent visualization of the distal ileal  loop consistent  with Crohn's.  Because of these findings, Dr. [name]  had arranged for the patient to have labs drawn today and for admission  for surgical intervention.   REVIEW OF SYSTEMS:  Please see HPI, otherwise, the patient does admit to  having a perirectal wound, which was completely resolved.  She admits to  abdominal pain and constipation for the last several weeks, however, she  usually has diarrhea.  She denies any nausea, vomiting, fevers, or  chill.  All other systems are currently negative.   PAST MEDICAL HISTORY:  1. Crohn disease.  2. History of perirectal abscesses secondary to Crohn disease.   PAST SURGICAL HISTORY:  1. Status post I&D of perirectal abscess in 1991 on the right side of      her buttock.  2. Status post I&D of left perirectal abscess in April 2010.   SOCIAL HISTORY:  The patient is currently unemployed and trying to get  disability.  She is divorced.  She is now smoking approximately half  a  pack of cigarettes a day and has not had any alcohol since January 2010.  She denies any illicit drug use.   ALLERGIES:  NKDA.   MEDICATIONS:  1. Entocort 3 mg p.o. t.i.d.  2. Darvocet 1-2 p.o. q.4 h. p.r.n.  3. Celexa 5 mg daily.  4. Caltrate D 600 mg daily.  5. Fish oil daily.  6. Potassium gluconate daily.  7. Vitamin B12 daily.  8. Trazodone 25 mg p.r.n., however, the patient was having nightmares      with this and has since stopped taking it.   PHYSICAL EXAMINATION:  GENERAL:  Mr. [name] is a very pleasant 40 year old  white female who is currently well developed, well nourished and in no  acute distress, however, very anxious and nervous.  VITAL SIGNS:  Blood pressure 98/64, pulse 81, respirations 20, and  temperature 98.5.  HEENT:  Head is normocephalic and atraumatic.  Sclerae noninjected.  Pupils are equal, round, and reactive to light.  Ears and nose without  any obvious masses or lesions.  No rhinorrhea.  Mouth is pink and moist.  Throat shows no  exudate.  NECK:  Supple.  Trachea is midline.  No thyromegaly.  HEART:  Regular rate and rhythm.  Normal S1 and S2.  No murmurs,  gallops, or rubs are noted, +2 carotid, radial, and pedal pulses  bilaterally.  LUNGS:  Clear to auscultation bilaterally with no wheezes, rhonchi, or  rales noted.  Respiratory effort is nonlabored.  ABDOMEN:  Soft, mild distention with tenderness throughout her lower  quadrant bilaterally.  She is more tender in the left lower quadrant  with very minimal voluntary guarding.  She does have active bowel sounds  and no scars or hernias noted.  RECTAL:  Deferred at this time as the patient's father and sister are  currently in the room.  MUSCULOSKELETAL:  All 4 extremities are symmetrical with no cyanosis,  clubbing, or edema.  SKIN:  Warm and dry without any obvious, masses, lesions, or rashes.  NEUROLOGIC:  Cranial nerves II through XII appear to be grossly intact.  Deep tendon reflex exam is deferred at this time.  PSYCH:  The patient is alert and oriented x3 with an appropriate affect,  however, she does seem quite anxious and is teary at times secondary to  anxiety about her surgery.   LABORATORIES:  White blood cell count 17,200, hemoglobin 13.5,  hematocrit 38.1, and platelet count is 348,000.  Sodium 135, potassium  3.7, chloride 99, CO2 20, glucose 97, BUN 9, and creatinine 0.65.   DIAGNOSTICS:  CT of the abdomen and pelvis reveals persistent  visualization of the distal ileal loop in the right abdomen with slight  small bowel distention proximal to the segment, which is not  significantly changed, but compatible with Crohn disease in the abdomen  and it shows a multiloculated abscess collection in the sigmoid  mesocolon with an additional fluid collection at the left adnexa,  sigmoid mesocolon, which may represent a left ovary or additional  abscess loculation.   IMPRESSION:  1. Crohn disease associated intraabdominal and perirectal abscesses.   2. Leukocytosis.   PLAN:  At this time, [name] will be admitted and placed on IV fluids  as well as Zosyn and various other p.r.n. medications for pain and  nausea.  She is also given a nicotine patch to help with her nicotine  cravings.  She will get a preoperative chest x-ray as well as EKG per  Dr. [name].  The plan is to proceed with surgical intervention later  tonight by Dr. [name].  The patient is aware of this and is agreed  for consent.      Henreitta Cea, PA      [name]. [name], M.D.  Electronically Signed    KEO/MEDQ  D:  05/24/2009  T:  05/25/2009  Job:  [id number]   cc:   Docia Chuck. [name], MD  [facility]

## 2011-05-13 NOTE — Op Note (Signed)
NAMEROSALEE, TOLLEY               ACCOUNT NO.:  [id number]   MEDICAL RECORD NO.:  [id number]          PATIENT TYPE:  INP   LOCATION:  5017                         FACILITY:  [place]   PHYSICIAN:  [name]. Weatherly, M.D.DATE OF BIRTH:  1971-09-15   DATE OF PROCEDURE:  04/05/2009  DATE OF DISCHARGE:                               OPERATIVE REPORT   PREOPERATIVE DIAGNOSES:  Large ischiorectal perirectal abscess, and the  patient with a history of Crohn disease.   OPERATION:  Examination under anesthesia, no internal fistula have been  noted, drainage of large ischiorectal abscess and packed in all sore  proctoscopic exam.   ANESTHESIA:  General anesthesia, lithotomy position.   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  I do not know who the surgeon was and then  approximately 6 years later she was noted to have Crohn disease.  Since  then she has had reoccurring problems with the Crohn disease and I think  was in the hospital in January and was probably seen at that time by Dr.  Johney Maine as there was a CT done during admission.  She is on the teaching  service and Dr. Johney Maine was the ordering surgeon.  At that time, she has  got a significant PICC line and sigmoid colon consistent with Crohn  disease and was treated with antibiotics steroids and etc.  She has got  known Crohn disease also on the right side, in the ileocecal area.  She  was readmitted recently to the teaching service and we were asked to see  her yesterday and she has got fluctuance on the left side.  Her previous  abscess and fistula had been on the opposite side.  We saw her and she  got a CT last night.  The findings are that this is a large ischiorectal  abscess, it looks like it is really slightly anterior to the mid  transverse anal area and the actual sigmoid colon looks better.  The  areas that  looks like possibly a GYN type abscess around the left ovary.  The colon itself had little fluid collection in the pelvis.  It does not  appear to be contiguous with the sigmoid colon and this is well  separated from this area around the anus.  Then there was thickenings in  the right colon also.  I recommended that we take her to the OR and  encounter very carefully, examine her to see if we can see an internal  fistula opening there if I we could, I was going to use C-tong since I  think it seem to be sphinteromy on the opposite site that she will have  little of any anal control and she says that she has already having  problems with basically swollen and good anal control.  We did not give  any additional antibiotics,  I think she is on Cipro and Flagyl.  She  was taken to the OR suite.  The anesthesiologist induced general  anesthesia, I then placed her up in the lithotomy position.  You can  feel this swelling to the left of her rectum below the perineal body  area and I first proctoscoped it to a 25 cm.  The anal endoscope  appears, there to be a little kind of web right at the dentate line  area, then once I stretched that it was good, but going easily and the  bowel mucosa may be a little hemorrhagic, but it certainly not in line,  it is obviously bad Crohn disease and I could not see any evidence of  any pus going into the anus down at the dentate line area.  It is  extended posterior like it some time, she is possibly had a little  fistula that goes to the posterior area even though this abscess now is  predominantly above the immediate trans-anal area and from good trials,  what you would think that it was not opened and it would be a direct  communication more anterolateral.  I then after carefully proctoscoped  in order to look in, prepped with Betadine solution and then used  anoscope bullet and looked in carefully, I used a right angle hemostat  trying to see if I could  find a little area that it is an internal  fistula opening could not.  I did not want to try to make an opening, so  I was very delicate and there was nurse inspection of all various areas.  Next, I use an 18-guage needle and fairly close to the perianal area  entered an about a cm depth you get into a big pocket, I took samples  out for aerobic and anaerobic cultures.  I then made with the cautery a  little opening going done deep.  This is I think right outside the  sphincter area and entered into this big pocket with a gush of fluid and  pus flowing out freely.  I then took a little wedge of actually a little  triangular skin out, there was a little bit of bleeding on the posterior  area that I sutured with a 3-0 Chromic suture.  I think there was a  small little blood vessel artery there and then I made the openings  where I can get my probably the tip from my thumb and the opening not  really a great big opening, but this gives good free access.  Before I  had actually this opening, there is large opening is, I have put the  irrigating syringe with Betadine saline and it is encounter directed  with a little bit of pressure to see if I could see any type of  communication going into the anus lateral anterior and posterior and  could not, and then after we thoroughly irrigated good hemostasis.  I  put in probably about 12 x 15 of 1 inch of iodoform gauze with Betadine  saline and placed it up in the high in so that it  is not hanging out.  I will remove the pack in the morning.  I would continue her on the  broad antibiotics.  I would not do anything about the intraabdominal  episodes at this time unless she become certainly more symptomatic,  which she may need repeat CT in  about 10 days.  She has been afebrile.  Hopefully, will feel better.  When she first came to be admitted this  time, she was complaining of pain starting in the lower abdomen  not this  pain around her anus, so that  we may have more of an issue in the  abdomen that is definitely appreciated at this time.      [name]. [name], M.D.  Electronically Signed     WJW/MEDQ  D:  04/05/2009  T:  04/06/2009  Job:  [id number]

## 2011-05-13 NOTE — Discharge Summary (Signed)
[name], [name] NO.:  [id number]   MEDICAL RECORD NO.:  [id number]          PATIENT TYPE:  INP   LOCATION:  5149                         FACILITY:  [facility]   PHYSICIAN:  Odis Hollingshead, M.D.DATE OF BIRTH:  February 26, 1971   DATE OF ADMISSION:  05/24/2009  DATE OF DISCHARGE:  06/02/2009                               DISCHARGE SUMMARY   ADDENDUM:  She is otherwise ready for discharge home.  She is afebrile.  Vital signs were stable.  She is saturating 96% on room air.  She has  had increased drainage from slight opening in the skin of the incision  line at about the umbilical region.  This drainage was mostly serous,  but little bit cloudy.  So, using a Q-tip, I opened up this can slightly  to reveal a 1.5 to 2-cm diameter opening with the same amount of depth.  This area was cultured.  The skin around the wound was without  induration or cellulitis.  Again, the patient was without fever.  So,  normal saline packing was started and plans to continue this b.i.d.  She  has another smaller skin opening at the lower part of the incision that  does not need to be packed, but needs a Neosporin dressing daily.  Again  wound cultures were obtained.  The patient has been on Unasyn here, but  since she is not having any fevers, no pain, no chills and her  leukocytosis postoperatively had resolved, I opted not been on  antibiotics unless the culture is positive.  Because the wound was  opened and culture was obtained and b.i.d. dressing changes was started,  I asked her to see if she could follow up with Dr. [name] in the  office in 7 days instead of 10-14 days as previously recommended.  Otherwise, again the patient is appropriate for discharge home and will  follow up with Dr. [name] as above.      [name], N.P.      Odis Hollingshead, M.D.  Electronically Signed    ALE/MEDQ  D:  06/02/2009  T:  06/03/2009  Job:  [id number]   cc:   [name].  [name], M.D.

## 2011-05-16 NOTE — Assessment & Plan Note (Signed)
[facility]                           GASTROENTEROLOGY OFFICE NOTE   [name], [name]                      MRN:          [id number]  DATE:09/21/2006                            DOB:          1971-09-29    Patient is self-referred.   REASON FOR CONSULTATION:  History of Crohn disease, currently with active  symptoms.   HISTORY:  This is a 40 year old white female with established Crohn disease,  who presents as a new patient to this practice for evaluation of active  symptoms.  Her sister, Darl Pikes, also has Crohn disease and is a patient of  mine.  This patient's history dates back to 1997 when she was diagnosed with  Crohn disease, having undergone small bowel follow through and flexible  sigmoidoscopy.  Evaluation was somewhat limited due to strained financial  resources at the time according to notes.  She was treated with  aminosalicylates in the form of Pentasa and varying degrees of prednisone  chronically.  She eventually underwent colonoscopy on two occasions in 1999.  Her disease is located in the terminal ileum as well as the left colon.  Biopsy confirmation of inflammatory bowel disease consistent with Crohn's  was obtained.  She was subsequently treated with 6-MP 50 mg daily.  She was  able to wean off prednisone and, for the most part, seemed to be doing well.  All of her care until the initial time of her diagnosis was with Dr. Ronald Lobo.  Her last visit with Dr. Cristina Gong was in July 2002, at which time  she was doing well on Purinethol 50 mg daily.  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  She has not been seen since.  She was apparently  discharged from Dr. Osborn Coho practice for noncompliance with appointments  since that last visit in 2002.  Present history is that of 6 months of  intermittent problems with abdominal discomfort and varying  degrees of loose  stools, worsening in recent weeks.  No nausea or vomiting.  She has had  progressive slow weight loss.  Her recent problem culminated in an emergency  room visit August 27, 2006.  At that time the chief complaint was severe  upper abdominal  pain as well as blood-tinged diarrhea.  CBC was  unremarkable with a normal hemoglobin at 14.8, and a normal white blood cell  count of 7.8.  Basic metabolic panel was normal except for a potassium of  3.4.  Urinalysis suggested urinary tract infection. CT scan of the abdomen  and pelvis revealed moderate inflammatory changes of the left colon and mild  inflammatory changes of the terminal ileum.  No evidence of abscess, free  air or fistula.  It states that she was discharged on ciprofloxacin for 3  days, Pentasa for 8 days, and prednisone for 7 days.  She is currently on no  medications.  She was referred to [facility] but has decided not to go,  stating to me that although she is  uninsured and employed, her husband makes  an income that would not qualify her for their services.  In addition to her  current GI complaints, she has been under a great deal of stress due to her  unemployment as well as strained relationship with her husband.  With  regards to her prior medications, she states that she tolerated them well  with minimal side effects.   PAST MEDICAL HISTORY:  As above.   PAST SURGICAL HISTORY:  Rectal fistulotomy.   ALLERGIES:  No known drug allergies.   CURRENT MEDICATIONS:  None.   FAMILY HISTORY:  Sister with Crohn disease.   SOCIAL HISTORY:  Patient is married without children.  Unemployed and  uninsured.  Smokes 10 cigarettes per day.  Drinks alcohol modestly.   PHYSICAL EXAMINATION:  GENERAL:  Intermittently upset and tearful female, in  no acute distress.  She is alert and oriented.  VITAL SIGNS:  Blood pressure 120/70, heart rate 58 and regular, weight 144  pounds.  She is 5 feet 8 inches in height.   HEENT:  Sclerae are anicteric.  Conjunctivae are pink.  Oral mucosa intact.  No aphthous ulcerations.  No adenopathy.  Thyroid is normal.  LUNGS:  Clear.  HEART:  Regular.  ABDOMEN:  Soft with mild tenderness in the right mid-abdomen.  Moderate  tenderness in the left lower quadrant to palpation.  There is no guarding or  rebound.  No mass.  Good bowel sounds heard.  RECTAL:  Deferred.  EXTREMITIES:  Without edema.   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  Presents with  progressive symptoms over the past 6 months, consistent with mild to  moderate ongoing active disease.  No evidence of obstruction or clinically  significant bleeding.   RECOMMENDATIONS:  1. Initiate budesonide 9 mg daily.  Six weeks of samples have been      provided.  2. 6-mercaptopurine 50 mg daily.  3. Stop smoking.  4. Office followup in 1 month.  Strict compliance stressed.                                   Docia Chuck. Geri Seminole., MD   JNP/MedQ  DD:  09/22/2006  DT:  09/24/2006  Job #:  [id number]

## 2011-05-16 NOTE — Procedures (Signed)
[place]  Patient:    [name], [name]                      MRN: [id number] Proc. Date: 06/05/00 Adm. Date:  [id number] Disc. Date: [id number] Attending:  Ernie Avena                           Procedure Report  PROCEDURE PERFORMED:  Colonoscopy with biopsies.  ENDOSCOPIST:  Cleotis Nipper, M.D.  INDICATIONS FOR PROCEDURE:  The patient is a 40 year old female with a several-year history of Crohns ileocolitis, most recently colonoscoped about a year and a half ago at which time moderately severe ileocolitis was present. The patient has been on 6 mercaptopurine for the past couple of years, most recently with good control of symptoms.  FINDINGS:  Burned out ileocolitis without significant active disease at this time.  DESCRIPTION OF PROCEDURE:  The nature, purpose and risks of the procedure were familiar to the patient from prior examination and she provided written consent.  Sedation was fentanyl 100 mcg and Versed 10 mg IV without arrhythmias or desaturation.  Digital exam demonstrated a slightly stenotic or fibrotic anal canal with a skin tag present.  The Olympus adjustable tension pediatric video colonoscope was advanced quite easily to the terminal ileum, which was entered for a distance of probably about 10 to 20 cm and pullback was then performed.  The terminal ileum had punctate erythema and a few areas of exudate but I did not see any frank ulcerations or severe inflammatory changes.  Overall, it ws a benign-appearing terminal ileum.  Pull-back was then performed through the length of the colon.  The quality of the prep was excellent and it is felt that all areas were adequately seen.  In the distal colon, the mucosa looked somewhat atrophic, with a somewhat blanched or pale appearance and a decrease in mucosal vascularity, but I did not see any actual inflammatory changes such as granularity, exudate or ulcerations,  whereas previously, she had significant ulcerations in the rectosigmoid region.  These have now totally resolved.  I did not biopsy this area.  Retroflexion was not performed in the rectum due to a small rectal ampulla but pullout through the anal canal did not demonstrate any ulcerations active in the anal canal at this time.  No polyps, cancer, active colitis, vascular malformations or diverticular disease were observed during todays exam.  The patient tolerated the procedure well and there were no apparent complications.  IMPRESSION: 1. Questionable very mild ileitis. 2. Essentially complete resolution of the ulcerations noted in the ileum and    distal colon a year and a half ago.  PLAN:  Await pathology on ileal biopsies.  Continue 6 mercaptopurine. DD:  06/05/00 TD:  06/09/00 Job: [id number]

## 2011-05-23 ENCOUNTER — Encounter: Payer: Self-pay | Admitting: *Deleted

## 2011-05-28 ENCOUNTER — Encounter: Payer: Self-pay | Admitting: Internal Medicine

## 2011-05-28 ENCOUNTER — Ambulatory Visit (INDEPENDENT_AMBULATORY_CARE_PROVIDER_SITE_OTHER): Payer: Medicaid Other | Admitting: Internal Medicine

## 2011-05-28 VITALS — BP 130/74 | HR 84 | Ht 68.0 in | Wt 169.0 lb

## 2011-05-28 DIAGNOSIS — E538 Deficiency of other specified B group vitamins: Secondary | ICD-10-CM

## 2011-05-28 DIAGNOSIS — K5080 Crohn's disease of both small and large intestine without complications: Secondary | ICD-10-CM

## 2011-05-28 DIAGNOSIS — R1011 Right upper quadrant pain: Secondary | ICD-10-CM

## 2011-05-28 NOTE — Patient Instructions (Signed)
Go to basement and have labs drawn today. Follow-up in 6 months

## 2011-05-28 NOTE — Progress Notes (Signed)
HISTORY OF PRESENT ILLNESS:  [name] is a 40 y.o. female with complicated Crohn's disease for which she underwent partial resection of the ileum and colon with subsequent colostomy in May of 2010. 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 She was last seen in the office in December of 2011. Chief complaint at that time was peristomal discomfort. See that dictation for details. Her last colonoscopy in April of 2011 did not reveal active Crohn's. She presents today for a routine six-month followup. Her only complaints are peristomal irritation. She does see an ostomy nurse who has been helpful. She also complains of some right upper quadrant burning discomfort which is reliably relieved with defecation. No nausea, vomiting, fevers, bleeding, or weight loss. She's actually had about 5 pound weight gain since her last visit.  REVIEW OF SYSTEMS:  All non-GI ROS negative except for depression.  Past Medical History  Diagnosis Date  . Crohn's disease     Large and small intestine - Followed by Dr. Henrene Madden  . Vitamin B12 deficiency     185 (211-911 normal) in 12/10  . Perirectal abscess 4/10    Treated with antibiotics.   . Depression     Past Surgical History  Procedure Date  . Rectal fistulotomy     X2  . Colon resection May 2010    Partial resection of ileum and colon now with colostomy  bag    Social History [name]  reports that she has been smoking Cigarettes.  She has been smoking about .5 packs per day. She does not have any smokeless tobacco history on file. She reports that she does not drink alcohol or use illicit drugs.  family history includes Breast cancer in her cousin; Cervical cancer in her maternal grandmother; Colitis in her sister; Crohn's disease in her brother; Diabetes in her cousin and father; and Prostate cancer in her paternal grandfather.  There is no history of Colon cancer.  No Known  Allergies     PHYSICAL EXAMINATION:  Vital signs: BP 130/74  Pulse 84  Ht 5' 8"  (1.727 m)  Wt 169 lb (76.658 kg)  BMI 25.70 kg/m2  LMP 05/05/2011 General: Well-developed, well-nourished, no acute distress HEENT: Sclerae are anicteric, conjunctiva pink. Oral mucosa intact Lungs: Clear Heart: Regular Abdomen: soft, nontender, nondistended, no obvious ascites, no peritoneal signs, normal bowel sounds. No organomegaly. Ostomy bag in the left lower quadrant. Prior surgical incision well healed without hernia Extremities: No edema Psychiatric: alert and oriented x3. Cooperative    ASSESSMENT:  #1. Severe and complicated Crohn's disease of the large and small intestine requiring surgery, May 2010, as described. No evidence of active disease on colonoscopy April 2011. #2. Peristomal irritation of the skin #3. Intermittent right upper quadrant discomfort relieved with defecation. Not worrisome by history or exam. Reassurance and observation at this point #4. History of B12 deficiency. Has been suboptimally compliant with her replacement therapy   PLAN:  #1. CBC, C-reactive protein, and B12 level today #2. Continue to work with [name], ostomy nurse. #3. Resume B12 replacement #4. Routine office followup in 6 months #5. Surveillance colonoscopy in one year

## 2011-05-29 ENCOUNTER — Other Ambulatory Visit (INDEPENDENT_AMBULATORY_CARE_PROVIDER_SITE_OTHER): Payer: Medicaid Other

## 2011-05-29 ENCOUNTER — Telehealth: Payer: Self-pay | Admitting: Internal Medicine

## 2011-05-29 ENCOUNTER — Other Ambulatory Visit: Payer: Self-pay

## 2011-05-29 DIAGNOSIS — D649 Anemia, unspecified: Secondary | ICD-10-CM

## 2011-05-29 DIAGNOSIS — K5080 Crohn's disease of both small and large intestine without complications: Secondary | ICD-10-CM

## 2011-05-29 LAB — CBC WITH DIFFERENTIAL/PLATELET
Eosinophils Relative: 1.1 % (ref 0.0–5.0)
HCT: 40.5 % (ref 36.0–46.0)
Hemoglobin: 14 g/dL (ref 12.0–15.0)
Lymphs Abs: 2.5 10*3/uL (ref 0.7–4.0)
MCV: 95.5 fl (ref 78.0–100.0)
Monocytes Absolute: 0.4 10*3/uL (ref 0.1–1.0)
Monocytes Relative: 5.5 % (ref 3.0–12.0)
Neutro Abs: 5.1 10*3/uL (ref 1.4–7.7)
Platelets: 223 10*3/uL (ref 150.0–400.0)
WBC: 8.2 10*3/uL (ref 4.5–10.5)

## 2011-05-29 NOTE — Telephone Encounter (Signed)
Results given to pt per Dr. [name].

## 2011-05-29 NOTE — Telephone Encounter (Signed)
Message copied by Faythe Casa on Thu May 29, 2011  3:07 PM ------      Message from: Irene Shipper      Created: Thu May 29, 2011  1:57 PM       Let pt know that labs all look great (normal)

## 2011-08-05 ENCOUNTER — Encounter (INDEPENDENT_AMBULATORY_CARE_PROVIDER_SITE_OTHER): Payer: Self-pay | Admitting: General Surgery

## 2011-08-06 ENCOUNTER — Ambulatory Visit (INDEPENDENT_AMBULATORY_CARE_PROVIDER_SITE_OTHER): Payer: Medicaid Other | Admitting: General Surgery

## 2011-08-06 ENCOUNTER — Encounter (INDEPENDENT_AMBULATORY_CARE_PROVIDER_SITE_OTHER): Payer: Self-pay | Admitting: General Surgery

## 2011-08-06 VITALS — BP 112/78 | HR 76 | Temp 97.4°F

## 2011-08-06 DIAGNOSIS — K5010 Crohn's disease of large intestine without complications: Secondary | ICD-10-CM

## 2011-08-06 DIAGNOSIS — K9403 Colostomy malfunction: Secondary | ICD-10-CM

## 2011-08-06 DIAGNOSIS — IMO0002 Reserved for concepts with insufficient information to code with codable children: Secondary | ICD-10-CM

## 2011-08-06 NOTE — Progress Notes (Signed)
Subjective:     Patient ID: [name], female   DOB: 08-21-1971, 40 y.o.   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 [name] and myself.  Review of Systems Her medications are reviewed and she is still taking Vicodin for pain but are not ride in the prescriptions     Objective:   Physical ExamThe patient looks good and the only suggestions I have is that she needs a larger size in the internal and the colostomy bag which she will try to get with her nextorder. 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 She is followed by Dr. Scarlette Shorts and has been unable to get on disability even though she's not been able to return to work. She gets her colostomy supplies through a program with local manufartor her size has only a 5/8 inch internal opening and I think the next size larger would work better.

## 2011-08-06 NOTE — Patient Instructions (Signed)
Try to get ice one size larger of a colostomy wafer and used the one's I gave you as an emergency. The skin and needs to be larger to allow more free drainage into the colostomy bag out plan on seeing you in 4 months

## 2011-12-18 ENCOUNTER — Telehealth (INDEPENDENT_AMBULATORY_CARE_PROVIDER_SITE_OTHER): Payer: Self-pay

## 2011-12-18 ENCOUNTER — Ambulatory Visit (INDEPENDENT_AMBULATORY_CARE_PROVIDER_SITE_OTHER): Payer: Medicaid Other | Admitting: General Surgery

## 2011-12-18 ENCOUNTER — Encounter: Payer: Self-pay | Admitting: *Deleted

## 2011-12-18 ENCOUNTER — Encounter (INDEPENDENT_AMBULATORY_CARE_PROVIDER_SITE_OTHER): Payer: Self-pay | Admitting: General Surgery

## 2011-12-18 VITALS — BP 152/90 | HR 76 | Temp 97.9°F | Resp 16 | Ht 68.0 in | Wt 168.6 lb

## 2011-12-18 DIAGNOSIS — K9403 Colostomy malfunction: Secondary | ICD-10-CM

## 2011-12-18 DIAGNOSIS — F32A Depression, unspecified: Secondary | ICD-10-CM

## 2011-12-18 DIAGNOSIS — K5010 Crohn's disease of large intestine without complications: Secondary | ICD-10-CM

## 2011-12-18 HISTORY — DX: Depression, unspecified: F32.A

## 2011-12-18 NOTE — Progress Notes (Signed)
Patient ID: [name], female   DOB: [birth year], 40 y.o.   MRN: [id number] BP 152/90  Pulse 76  Temp(Src) 97.9 F (36.6 C) (Temporal)  Resp 16  Ht 5' 8"  (1.727 m)  Wt 168 lb 9.6 oz (76.476 kg)  BMI 25.64 kg/m2 [name] returns and is in distraught situation and that her father has asked her to move and she has no other place to live.  Security disability is still pending and she is not sure what options she has remained in she still complains of excessive pain in the lower abdomen even better she's not acutely tender she is managing her stoma he denies she refuses to cut the abdomen is large as I think it should be and she has seen Be probed and is given her colostomy supplies to help from others. She is followed by Dr. [name] and I suggested she get back in the family practice where she originally was being managed her when I first saw her and weak all over and they said they would contact her for followup appointment. She has a past history of depression and other psychological issues as well as her Crohn's disease which hopefully is not an acute problem. I suggested that she did continue to be followed by Dr. Excell Seltzer as he seen her previously in Dr. Channing Mutters assisted on her surgery only for a short time. Her perineal wounds have healed at hopely will not become an acute problem again. She understands that her colostomy is permanent even though she still has a minus overall the fistulas etc. that she's had he noticed no active Crohn's demonstrated the last time Dr. [name] did a flexible sigmoidoscopy nor I would be reluctant to even possibly consider a colostomy takedown.

## 2011-12-18 NOTE — Telephone Encounter (Signed)
Patient seeking to re-establish care with [facility].  Request given to Lyn the nurse by return telephone call.

## 2011-12-18 NOTE — Telephone Encounter (Signed)
This encounter was created in error - please disregard.

## 2011-12-19 ENCOUNTER — Telehealth (INDEPENDENT_AMBULATORY_CARE_PROVIDER_SITE_OTHER): Payer: Self-pay

## 2011-12-19 NOTE — Telephone Encounter (Signed)
Called to patient to check to see if she has tried to contact [facility] for an appointment.

## 2012-02-11 ENCOUNTER — Encounter (INDEPENDENT_AMBULATORY_CARE_PROVIDER_SITE_OTHER): Payer: Self-pay | Admitting: Surgery

## 2012-03-08 ENCOUNTER — Encounter (INDEPENDENT_AMBULATORY_CARE_PROVIDER_SITE_OTHER): Payer: Self-pay | Admitting: General Surgery

## 2012-03-11 ENCOUNTER — Telehealth (INDEPENDENT_AMBULATORY_CARE_PROVIDER_SITE_OTHER): Payer: Self-pay | Admitting: General Surgery

## 2012-03-11 NOTE — Telephone Encounter (Signed)
Patient needs an ltf appt before 05/06/12, she has a disability hearing and her appt is important for this hearing.  Please call

## 2012-03-23 ENCOUNTER — Telehealth (INDEPENDENT_AMBULATORY_CARE_PROVIDER_SITE_OTHER): Payer: Self-pay

## 2012-03-23 NOTE — Telephone Encounter (Signed)
Called patient with appointment date & time for 04/08/12 @ 10:00 w/Dr. Excell Seltzer.

## 2012-04-08 ENCOUNTER — Ambulatory Visit (INDEPENDENT_AMBULATORY_CARE_PROVIDER_SITE_OTHER): Payer: Medicaid Other | Admitting: General Surgery

## 2012-04-08 ENCOUNTER — Encounter (INDEPENDENT_AMBULATORY_CARE_PROVIDER_SITE_OTHER): Payer: Self-pay | Admitting: General Surgery

## 2012-04-08 VITALS — BP 130/74 | HR 76 | Temp 98.0°F | Resp 16 | Ht 68.0 in | Wt 166.4 lb

## 2012-04-08 DIAGNOSIS — R109 Unspecified abdominal pain: Secondary | ICD-10-CM

## 2012-04-08 DIAGNOSIS — IMO0002 Reserved for concepts with insufficient information to code with codable children: Secondary | ICD-10-CM

## 2012-04-08 DIAGNOSIS — K5080 Crohn's disease of both small and large intestine without complications: Secondary | ICD-10-CM

## 2012-04-08 MED ORDER — HYDROCODONE-ACETAMINOPHEN 10-500 MG PO TABS: 1.0000 | ORAL_TABLET | Freq: Four times a day (QID) | ORAL | Status: DC | PRN

## 2012-04-08 NOTE — Progress Notes (Signed)
Chief complaint: Followup Crohn's disease, colostomy and abdominal pain  History: Patient is a 41 year old female with a history of severe Crohn's disease. She had severe perirectal abscesses operated on by Dr. [name] in 2010 and then subsequently developed a pelvic abscess secondary to likely perforation of the distal sigmoid colon. She underwent laparotomy with ileocecectomy for stricture and sigmoid colectomy and end colostomy. She has had ongoing issues since that time with pain around her ostomy and also some abdominal pain that generally has been in the right upper quadrant and related to bowel movements. These issues have continued essentially unchanged since her last visit. She describes quite a bit of pain at the stoma site near the skin with bowel movements. She has not noted a lump or bulge around the ostomy site. Bowel movements are loose. She has some occasional right upper quadrant pain related to bowel movements but this actually is not as severe as it has been in the past. She has not noted any wound or drainage from her perineum. She had a colonoscopy by Dr. [name] one year ago without evidence of active Crohn's disease and is not currently on any medications specifically for Crohn's disease. She is here today for routine followup. She has a lot of social issues and a difficult living situation that contributes to her difficulties.  Past Medical History  Diagnosis Date  . Crohn's disease     Large and small intestine - Followed by Dr. [name]  . Vitamin B12 deficiency     185 (211-911 normal) in 12/10  . Perirectal abscess 4/10    Treated with antibiotics.   . Crohn's   . Enteritis (regional)     small and large intestine  . B12 deficiency   . Perirectal abscess   . IBS (irritable bowel syndrome)   . Fatigue   . Family history of breast cancer     1st cousin  . Depression 12/18/11    pt states "its not worth living anymore"    Past Surgical History  Procedure Date  . Rectal  fistulotomy     X2  . Colon resection May 2010    Partial resection of ileum and colon now with colostomy  bag  . Rectal fistulotomy   . Colon surgery     resection  . Abscess removal 1991 and 2010   Current Outpatient Prescriptions  Medication Sig Dispense Refill  . cyanocobalamin (,VITAMIN B-12,) 1000 MCG/ML injection Inject 1,000 mcg into the muscle once.        Marland Kitchen HYDROcodone-acetaminophen (LORTAB) 10-500 MG per tablet Take 1 tablet by mouth 2 (two) times daily. PRN       . Sulfamethoxazole-Trimethoprim (BACTRIM PO) Take by mouth as needed.         No Known Allergies History  Substance Use Topics  . Smoking status: Current Everyday Smoker -- 0.5 packs/day    Types: Cigarettes  . Smokeless tobacco: Not on file  . Alcohol Use: No     was drinking 12 PACKS BEER/WEEK---stopped in January 2012   Exam: BP 130/74  Pulse 76  Temp(Src) 98 F (36.7 C) (Temporal)  Resp 16  Ht 5' 8"  (1.727 m)  Wt 166 lb 6.4 oz (75.479 kg)  BMI 25.30 kg/m2  Gen.: Well-developed, does not appear ill. Frequently tearful. Skin: No rash or infection Lungs: Clear equal breath sounds Cardiovascular: No edema Abdomen: Well-healed low midline incision. There is mild tenderness in the right upper quadrant. She has a lot of tenderness around and  over her stoma. Examination of the stoma shows it to be generally healthy although there is a small area of whitish discoloration inferiorly that she says is very painful. I did not take the backing off to examine the skin. Rectum perineum: She has some minor fissures and quite a bit of tenderness around the anus. I did not do a digital exam due to tenderness but appears that she likely has some degree of stricture.  Assessment and plan: History of severe Crohn's disease with surgery as above. She has ongoing problems of significant pain around her ostomy and some chronic abdominal pain. I do not see any specific surgical problem by history or exam. She may benefit by a  repeat evaluation by enterostomal therapy and we will make this referral. [facility] which I think is a good idea. She may well require chronic pain management. She requested pain medication today and I gave her one prescription for Vicodin 50 with one refill but told her that further pain medication would need to come from her primary physician or a pain clinic as her needs are chronic. She returned here in 6 months.

## 2012-06-08 ENCOUNTER — Encounter: Payer: Self-pay | Admitting: Internal Medicine

## 2012-06-08 ENCOUNTER — Ambulatory Visit (INDEPENDENT_AMBULATORY_CARE_PROVIDER_SITE_OTHER): Payer: Medicaid Other | Admitting: Internal Medicine

## 2012-06-08 ENCOUNTER — Other Ambulatory Visit (INDEPENDENT_AMBULATORY_CARE_PROVIDER_SITE_OTHER): Payer: Medicare Other

## 2012-06-08 VITALS — BP 112/62 | HR 88 | Ht 68.0 in | Wt 167.0 lb

## 2012-06-08 DIAGNOSIS — K5090 Crohn's disease, unspecified, without complications: Secondary | ICD-10-CM

## 2012-06-08 DIAGNOSIS — E538 Deficiency of other specified B group vitamins: Secondary | ICD-10-CM

## 2012-06-08 LAB — BASIC METABOLIC PANEL
CO2: 29 mEq/L (ref 19–32)
GFR: 98.82 mL/min (ref >60.00)
Glucose, Bld: 79 mg/dL (ref 70–99)
Potassium: 4.1 mEq/L (ref 3.5–5.1)
Sodium: 141 mEq/L (ref 135–145)

## 2012-06-08 LAB — CBC WITH DIFFERENTIAL/PLATELET
Basophils Absolute: 0 10*3/uL (ref 0.0–0.1)
Eosinophils Absolute: 0.1 10*3/uL (ref 0.0–0.7)
HCT: 40 % (ref 36.0–46.0)
Lymphs Abs: 2.3 10*3/uL (ref 0.7–4.0)
MCHC: 33.2 g/dL (ref 30.0–36.0)
Monocytes Absolute: 0.4 10*3/uL (ref 0.1–1.0)
Monocytes Relative: 6.8 % (ref 3.0–12.0)
Platelets: 201 10*3/uL (ref 150.0–400.0)
RDW: 13.8 % (ref 11.5–14.6)

## 2012-06-08 LAB — HEPATIC FUNCTION PANEL
AST: 10 U/L (ref 0–37)
Albumin: 3.6 g/dL (ref 3.5–5.2)
Alkaline Phosphatase: 61 U/L (ref 39–117)

## 2012-06-08 LAB — HIGH SENSITIVITY CRP: CRP, High Sensitivity: 1.6 mg/dL (ref 0.000–5.000)

## 2012-06-08 MED ORDER — CYANOCOBALAMIN 1000 MCG/ML IJ SOLN: 1000.0000 ug | Freq: Once | INTRAMUSCULAR | Status: DC

## 2012-06-08 MED ORDER — CYANOCOBALAMIN 1000 MCG/ML IJ SOLN
1000.0000 ug | Freq: Once | INTRAMUSCULAR | Status: AC
  Administered 2012-06-08: 1000 ug via INTRAMUSCULAR

## 2012-06-08 NOTE — Progress Notes (Signed)
HISTORY OF PRESENT ILLNESS:  [name] is a 41 y.o. female with complicated Crohn's disease for which she underwent partial resection of the ileum and colon with subsequent colostomy in May of 2010. She also has a history of perirectal abscess, B12 deficiency, anxiety/depression, and chronic tobacco abuse. She was last seen in the office 05/28/2011. See that dictation. She was asked to followup in 6 months, but follows up at this time. Currently she is without her primary provider, but has applied to be a patient with the family medicine clinic. She has not had a 12 supplementation in 6 months. Her only complaint is that of bloating as well as pain around the stoma (skin). She has had this previously, but less of an issue with aggressive skin care. She recently saw general surgery, Dr. Excell Seltzer. She denies abdominal pain or bleeding. No weight loss. No perirectal complaints.  REVIEW OF SYSTEMS:  All non-GI ROS negative except for his IV, depression, menstrual pain  Past Medical History  Diagnosis Date  . Crohn's disease     Large and small intestine - Followed by Dr. Henrene Madden  . Vitamin B12 deficiency     185 (211-911 normal) in 12/10  . Perirectal abscess 4/10    Treated with antibiotics.   . Enteritis (regional)     small and large intestine  . B12 deficiency   . Perirectal abscess   . IBS (irritable bowel syndrome)   . Fatigue   . Family history of breast cancer     1st cousin  . Depression 12/18/11    pt states "its not worth living anymore"     Past Surgical History  Procedure Date  . Rectal fistulotomy     X2  . Colon resection May 2010    Partial resection of ileum and colon now with colostomy  bag  . Rectal fistulotomy   . Colon surgery     resection  . Abscess removal 1991 and 2010    Social History [name]  reports that she has been smoking Cigarettes.  She has been smoking about .5 packs per day. She has never used smokeless tobacco. She reports that she  does not drink alcohol or use illicit drugs.  family history includes Breast cancer in her cousin; Cancer in her paternal grandfather; Cervical cancer in her maternal grandmother; Colitis in her sister; Crohn's disease in her brother; Diabetes in her cousin and father; Heart disease in her father; Hyperlipidemia in her father; Other in her father; and Prostate cancer in her paternal grandfather.  There is no history of Colon cancer.  No Known Allergies     PHYSICAL EXAMINATION: Vital signs: BP 112/62  Pulse 88  Ht 5' 8"  (1.727 m)  Wt 167 lb (75.751 kg)  BMI 25.39 kg/m2 General: Well-developed, well-nourished, no acute distress HEENT: Sclerae are anicteric, conjunctiva pink. Oral mucosa intact Lungs: Clear Heart: Regular Abdomen: soft, nontender, nondistended, no obvious ascites,  normal bowel sounds. No organomegaly. Stoma left lower quadrant with ostomy bag. Extremities: No edema Psychiatric: alert and oriented x3. Cooperative      ASSESSMENT:  #1. Severe and complicated Crohn's disease of the large and small intestine requiring surgery in May of 2010 as described. No evidence of active disease on colonoscopy April 2011. On no postoperative therapy. Continue with active surveillance. #2Henrene Madden stomal irritation of the skin #3. History of B12 deficiency. Suboptimal compliance with replacement therapy.   PLAN:  #1. CBC, CRP, B12 level today #2. Continue  with aggressive care for the ostomy site. Continue to work with ostomy nurse as needed #3. B12 shot today. Future B12 replacement with PCP. #4. Routine followup in 1 year. #5. Discussed surveillance colonoscopy. Deferred for 1 year.

## 2012-06-08 NOTE — Patient Instructions (Addendum)
You have been given a separate informational sheet regarding your tobacco use, the importance of quitting and local resources to help you quit.  Your physician has requested that you go to the basement for lab work before leaving today  You have also been given a B12 injection

## 2012-08-10 ENCOUNTER — Ambulatory Visit (INDEPENDENT_AMBULATORY_CARE_PROVIDER_SITE_OTHER): Payer: Medicare Other | Admitting: Family Medicine

## 2012-08-10 ENCOUNTER — Encounter: Payer: Self-pay | Admitting: Family Medicine

## 2012-08-10 VITALS — BP 125/82 | HR 74 | Temp 98.4°F | Ht 68.0 in | Wt 167.9 lb

## 2012-08-10 DIAGNOSIS — K5080 Crohn's disease of both small and large intestine without complications: Secondary | ICD-10-CM

## 2012-08-10 DIAGNOSIS — F17200 Nicotine dependence, unspecified, uncomplicated: Secondary | ICD-10-CM

## 2012-08-10 DIAGNOSIS — Z720 Tobacco use: Secondary | ICD-10-CM

## 2012-08-10 DIAGNOSIS — F329 Major depressive disorder, single episode, unspecified: Secondary | ICD-10-CM

## 2012-08-10 DIAGNOSIS — E538 Deficiency of other specified B group vitamins: Secondary | ICD-10-CM

## 2012-08-10 DIAGNOSIS — G8928 Other chronic postprocedural pain: Secondary | ICD-10-CM

## 2012-08-10 MED ORDER — CYANOCOBALAMIN 1000 MCG/ML IJ SOLN
1000.0000 ug | Freq: Once | INTRAMUSCULAR | Status: AC
  Administered 2012-08-10: 1000 ug via INTRAMUSCULAR

## 2012-08-10 NOTE — Patient Instructions (Addendum)
Nice to meet you. We can treat your B12 levels. Please make an appointment for physical and for pain management. You can get your mammogram. Think about quitting smoking. Please let me know if we can help.

## 2012-08-10 NOTE — Addendum Note (Signed)
Addended by: Teddy Spike on: 08/10/2012 06:04 PM   Modules accepted: Orders

## 2012-08-10 NOTE — Assessment & Plan Note (Signed)
Patient is contemplative. Offered assistance she refused.

## 2012-08-10 NOTE — Assessment & Plan Note (Signed)
Not controlled per patient. No narcotics will be prescribed at new patient visit. Will have patient return to reassess this problem. Discussed side effects of long-term narcotics. Will strive to avoid this as she seems functional and has not taken any Vicodin in several months now. We may discuss depression in congruence with chronic pain treatment to avoid narcotics, consider a trial of Cymbalta or amitriptyline.

## 2012-08-10 NOTE — Assessment & Plan Note (Signed)
Injection given today. Patient will followup for B12 level and other labs.

## 2012-08-10 NOTE — Assessment & Plan Note (Signed)
Followup within one month to complete a PHQ9, mood disorder questionnaire with her family history. If her depression and anxiety seem to impact her life, will discuss trial of SNRI or TCA in setting of chronic pain.

## 2012-08-10 NOTE — Progress Notes (Signed)
Subjective:    Patient ID: [name], female    DOB: 08-07-1971, 41 y.o.   MRN: [id number]  HPI New patient to establish care.  1. Colostomy pain. Patient with Crohn's disease status post ileo-colectomy with permanent colostomy, performed in 2010. Patient states she has had pain from the colostomy site related to movement, positioning and manipulating the bag since the procedure. It has improved mildly, however she still feels that she needs pain medication to control this. She has previously been prescribed Vicodin, Norco by her surgeon and from previous PCP at [facility]. Date the bag is currently functioning well and no active issues. She is using Tylenol and Aleve for pain currently.  2. B12 deficiency. This is related to her intestinal surgery. She has previously given herself injections and had injections in the clinic. Has not had a B12 level drawn over one year. Due for an injection today. Denies any worsening fatigue, notable symptoms.  3. depression. States she has trouble with depression related to medical conditions mainly Crohn's disease, present since 35. Does not currently take treatment or have therapist. does feel safe at home, lives with father. No SI/HI. Previous hx of alcohol abuse, none in past year. Not ready to discuss smoking cessation.  Past Medical History  Diagnosis Date  . Crohn's disease     Large and small intestine - Followed by Dr. [name]  . Vitamin B12 deficiency     185 (211-911 normal) in 12/10  . Perirectal abscess 4/10    Treated with antibiotics.   . Enteritis (regional)     small and large intestine  . B12 deficiency   . Perirectal abscess   . IBS (irritable bowel syndrome)   . Fatigue   . Family history of breast cancer     1st cousin  . Depression 12/18/11    pt states "its not worth living anymore"    Past Surgical History  Procedure Date  . Rectal fistulotomy     X2  . Colon resection May 2010    Partial resection  of ileum and colon now with colostomy  bag  . Rectal fistulotomy   . Colon surgery     resection  . Abscess removal 1991 and 2010   Family History  Problem Relation Age of Onset  . Colon cancer Neg Hx   . Diabetes Father   . Other Father     crohns  . Heart disease Father   . Hyperlipidemia Father   . Alcohol abuse Father   . Crohn's disease Father   . Colitis Sister   . Crohn's disease Brother   . Prostate cancer Paternal Grandfather   . Cancer Paternal Grandfather     prostate  . Cervical cancer Maternal Grandmother     ?  Marland Kitchen Breast cancer Cousin   . Diabetes Cousin    History   Social History  . Marital Status: Divorced    Spouse Name: N/A    Number of Children: 0  . Years of Education: N/A   Occupational History  . UNEMPLOYED     NO INSURANCE   Social History Main Topics  . Smoking status: Current Everyday Smoker -- 1.0 packs/day    Types: Cigarettes  . Smokeless tobacco: Never Used   Comment: Counseling sheet given 05-2012   . Alcohol Use: No     was drinking 12 PACKS BEER/WEEK---stopped in January 2012  . Drug Use: No  . Sexually Active: Not on file  Other Topics Concern  . Not on file   Social History Narrative    Patient is divorced without children, unemployed, uninsured , uses caffeine daily, gets regular exercise. High school graduate, disabled.     Review of Systems See HPI otherwise negative.    Objective:   Physical Exam  Vitals reviewed. Constitutional: She is oriented to person, place, and time. She appears well-developed and well-nourished. No distress.  HENT:  Head: Normocephalic and atraumatic.  Mouth/Throat: Oropharynx is clear and moist.  Eyes: EOM are normal. Pupils are equal, round, and reactive to light.  Neck: Neck supple. No thyromegaly present.  Cardiovascular: Normal rate, regular rhythm and normal heart sounds.   No murmur heard. Pulmonary/Chest: Effort normal and breath sounds normal. No respiratory distress. She has no  wheezes.  Abdominal: Soft. Bowel sounds are normal. She exhibits no distension.       Ostomy bag site clean, dry. Some pink granulation tissue in central opening. No skin breakdown.  Musculoskeletal: She exhibits no edema and no tenderness.  Neurological: She is alert and oriented to person, place, and time. No cranial nerve deficit. Coordination normal.  Skin: No rash noted. She is not diaphoretic.  Psychiatric: She has a normal mood and affect. Her behavior is normal. Thought content normal.       Speech normal.        Assessment & Plan:

## 2012-09-01 ENCOUNTER — Encounter: Payer: Self-pay | Admitting: Family Medicine

## 2012-09-01 ENCOUNTER — Ambulatory Visit (INDEPENDENT_AMBULATORY_CARE_PROVIDER_SITE_OTHER): Payer: Medicare Other | Admitting: Family Medicine

## 2012-09-01 VITALS — BP 154/93 | HR 81 | Ht 68.0 in | Wt 167.0 lb

## 2012-09-01 DIAGNOSIS — IMO0001 Reserved for inherently not codable concepts without codable children: Secondary | ICD-10-CM

## 2012-09-01 DIAGNOSIS — K912 Postsurgical malabsorption, not elsewhere classified: Secondary | ICD-10-CM

## 2012-09-01 DIAGNOSIS — F329 Major depressive disorder, single episode, unspecified: Secondary | ICD-10-CM

## 2012-09-01 DIAGNOSIS — R030 Elevated blood-pressure reading, without diagnosis of hypertension: Secondary | ICD-10-CM

## 2012-09-01 DIAGNOSIS — G8928 Other chronic postprocedural pain: Secondary | ICD-10-CM

## 2012-09-01 DIAGNOSIS — F3289 Other specified depressive episodes: Secondary | ICD-10-CM

## 2012-09-01 DIAGNOSIS — K5090 Crohn's disease, unspecified, without complications: Secondary | ICD-10-CM

## 2012-09-01 DIAGNOSIS — K90829 Short bowel syndrome, unspecified: Secondary | ICD-10-CM

## 2012-09-01 MED ORDER — HYDROCODONE-ACETAMINOPHEN 5-325 MG PO TABS: 1.0000 | ORAL_TABLET | Freq: Four times a day (QID) | ORAL | Status: AC | PRN

## 2012-09-01 MED ORDER — DULOXETINE HCL 30 MG PO CPEP: 30.0000 mg | ORAL_CAPSULE | Freq: Every day | ORAL | Status: DC

## 2012-09-01 NOTE — Assessment & Plan Note (Signed)
New finding. Will followup at next visit. Check TSH, other labs within normal limits recently.

## 2012-09-01 NOTE — Progress Notes (Signed)
  Subjective:    Patient ID: [name], female    DOB: 1971/01/19, 41 y.o.   MRN: [id number]  HPI  1. Stoma pain. Chronic pain since May 2010 for small and large bowel resection secondary to Crohn's disease. Her pain is been constant since the operation. Was evaluated by surgeon and states there's not a surgical issue. Pain is with movement and constantly. Feels sharp. Previously treated with Vicodin which helped mildly. No other pain medications tried. Denies any change in bowel output, fevers, chills, weight loss.  2. Depression. Related to her chronic medical condition and dealing with the stoma. Feels unable to relate to appears. Recently moved out of the house with her father and feels as a positive change. PHQ 9 score is 11. No active SI/HI  3. Elevated BP. States this has been a problem before related to stress sometimes. Never been treated. No alcohol use. BP Readings from Last 3 Encounters:  09/01/12 154/93  08/10/12 125/82  06/08/12 112/62   Review of Systems See HPI otherwise negative.  reports that she has been smoking Cigarettes.  She has been smoking about 1 pack per day. She has never used smokeless tobacco.     Objective:   Physical Exam  Vitals reviewed. Constitutional: She is oriented to person, place, and time. She appears well-developed and well-nourished.  HENT:  Head: Normocephalic and atraumatic.  Eyes: EOM are normal. Pupils are equal, round, and reactive to light.  Neck: Neck supple. No thyromegaly present.  Cardiovascular: Normal rate, regular rhythm and normal heart sounds.   No murmur heard. Pulmonary/Chest: Effort normal.  Abdominal: Soft. Bowel sounds are normal. She exhibits no distension. There is tenderness. There is no rebound and no guarding.       Mild TTP surrounding stoma pouch.   Musculoskeletal: She exhibits no edema and no tenderness.  Neurological: She is alert and oriented to person, place, and time. Coordination normal.  Skin: No rash  noted.  Psychiatric: She has a normal mood and affect. Her behavior is normal. Judgment and thought content normal.       Assessment & Plan:

## 2012-09-01 NOTE — Assessment & Plan Note (Signed)
PHQ 9 score is 11 today. Worsened since her operation and ostomy bag in 2010. Will start low dose Cymbalta in setting of chronic pain. Followup in 2 weeks. Advised to discontinue for any mood changes or worsening.

## 2012-09-01 NOTE — Patient Instructions (Addendum)
Lets try to treat pain with depression. Start cymbalta 30 mg daily. If this is ok, can increase to 60 mg in next 2 weeks. Stop if any side effects noted. You can use vicodin only when pain is bad. Will not be long term solution. Make sure to take calcium and vitamin D to keep bones strong. Make appointment in next 2-4 weeks for med check and pap smear.\

## 2012-09-01 NOTE — Assessment & Plan Note (Signed)
Start trial of Cymbalta. Vicodin low-dose #10 dispensed for severe breakthrough pain only. Discussed with patient expectations that narcotics will not be prescribed long-term. Controlled substances policy reviewed and signed.

## 2014-08-07 ENCOUNTER — Encounter: Payer: Self-pay | Admitting: Internal Medicine

## 2015-03-28 ENCOUNTER — Other Ambulatory Visit (INDEPENDENT_AMBULATORY_CARE_PROVIDER_SITE_OTHER): Payer: Medicare Other

## 2015-03-28 ENCOUNTER — Ambulatory Visit: Payer: Medicare Other | Admitting: Internal Medicine

## 2015-03-28 ENCOUNTER — Ambulatory Visit (INDEPENDENT_AMBULATORY_CARE_PROVIDER_SITE_OTHER): Payer: Medicare Other | Admitting: Internal Medicine

## 2015-03-28 ENCOUNTER — Encounter: Payer: Self-pay | Admitting: Internal Medicine

## 2015-03-28 VITALS — BP 124/64 | HR 84 | Ht 68.0 in | Wt 147.0 lb

## 2015-03-28 DIAGNOSIS — K9400 Colostomy complication, unspecified: Secondary | ICD-10-CM | POA: Diagnosis not present

## 2015-03-28 DIAGNOSIS — K50914 Crohn's disease, unspecified, with abscess: Secondary | ICD-10-CM | POA: Diagnosis not present

## 2015-03-28 DIAGNOSIS — K5090 Crohn's disease, unspecified, without complications: Secondary | ICD-10-CM

## 2015-03-28 DIAGNOSIS — Z716 Tobacco abuse counseling: Secondary | ICD-10-CM

## 2015-03-28 DIAGNOSIS — E538 Deficiency of other specified B group vitamins: Secondary | ICD-10-CM

## 2015-03-28 LAB — CBC WITH DIFFERENTIAL/PLATELET
BASOS ABS: 0 10*3/uL (ref 0.0–0.1)
Basophils Relative: 0.5 % (ref 0.0–3.0)
EOS ABS: 0 10*3/uL (ref 0.0–0.7)
Eosinophils Relative: 0.6 % (ref 0.0–5.0)
HCT: 42.4 % (ref 36.0–46.0)
HEMOGLOBIN: 14.6 g/dL (ref 12.0–15.0)
Lymphocytes Relative: 35.2 % (ref 12.0–46.0)
Lymphs Abs: 2.4 10*3/uL (ref 0.7–4.0)
MCHC: 34.4 g/dL (ref 30.0–36.0)
MCV: 94.7 fl (ref 78.0–100.0)
MONO ABS: 0.4 10*3/uL (ref 0.1–1.0)
MONOS PCT: 5.3 % (ref 3.0–12.0)
NEUTROS ABS: 4 10*3/uL (ref 1.4–7.7)
NEUTROS PCT: 58.4 % (ref 43.0–77.0)
PLATELETS: 247 10*3/uL (ref 150.0–400.0)
RBC: 4.48 Mil/uL (ref 3.87–5.11)
RDW: 14.6 % (ref 11.5–15.5)
WBC: 6.9 10*3/uL (ref 4.0–10.5)

## 2015-03-28 LAB — COMPREHENSIVE METABOLIC PANEL
ALT: 11 U/L (ref 0–35)
AST: 11 U/L (ref 0–37)
Albumin: 4.2 g/dL (ref 3.5–5.2)
Alkaline Phosphatase: 60 U/L (ref 39–117)
BILIRUBIN TOTAL: 0.8 mg/dL (ref 0.2–1.2)
BUN: 5 mg/dL — ABNORMAL LOW (ref 6–23)
CALCIUM: 9.7 mg/dL (ref 8.4–10.5)
CHLORIDE: 102 meq/L (ref 96–112)
CO2: 32 mEq/L (ref 19–32)
Creatinine, Ser: 0.72 mg/dL (ref 0.40–1.20)
GFR: 92.87 mL/min (ref >60.00)
GLUCOSE: 83 mg/dL (ref 70–99)
Potassium: 4.3 mEq/L (ref 3.5–5.1)
Sodium: 134 mEq/L — ABNORMAL LOW (ref 135–145)
Total Protein: 7.4 g/dL (ref 6.0–8.3)

## 2015-03-28 LAB — HIGH SENSITIVITY CRP: CRP HIGH SENSITIVITY: 0.7 mg/L (ref 0.000–5.000)

## 2015-03-28 LAB — VITAMIN B12: VITAMIN B 12: 199 pg/mL — AB (ref 211–911)

## 2015-03-28 NOTE — Patient Instructions (Signed)
Your physician has requested that you go to the basement for lab work before leaving today    You have been scheduled for a colonoscopy. Please follow written instructions given to you at your visit today.  If you use inhalers (even only as needed), please bring them with you on the day of your procedure.

## 2015-03-28 NOTE — Progress Notes (Signed)
HISTORY OF PRESENT ILLNESS:  [name] is a 44 y.o. female with complicated Crohn's disease for which she underwent partial resection of the ileum and colon with subsequent colostomy in May 2010. Also a history of perirectal abscess, B12 deficiency, chronic tobacco abuse, and anxiety/depression. Has demonstrated suboptimal medical compliance. She was last evaluated 06/08/2012. CBC, comprehensive metabolic panel, and CRP were unremarkable. She continues to have discomfort around the stoma for which she has seen surgery. She has been noncompliant with B12 supplementation and regular visits with her PCP, though she does see family practice when needed. Her last colonoscopy postoperatively was April 2011. No active disease at that time. She was to follow-up in one year, but follows up at this time. She denies fevers, bleeding, or other complaints. She requests a refill of her colostomy supplies (previously provided by surgery). She continues to smoke  REVIEW OF SYSTEMS:  All non-GI ROS negative except for anxiety/depression  Past Medical History  Diagnosis Date  . Crohn's disease     Large and small intestine - Followed by Dr. [name]  . Vitamin B12 deficiency     185 (211-911 normal) in 12/10  . Perirectal abscess 4/10    Treated with antibiotics.   . Enteritis (regional)     small and large intestine  . B12 deficiency   . Perirectal abscess   . IBS (irritable bowel syndrome)   . Fatigue   . Family history of breast cancer     1st cousin  . Depression 12/18/11    pt states "its not worth living anymore"     Past Surgical History  Procedure Laterality Date  . Rectal fistulotomy      X2  . Colon resection  May 2010    Partial resection of ileum and colon now with colostomy  bag  . Rectal fistulotomy    . Colon surgery      resection  . Abscess removal  1991 and 2010  . Small intestine surgery  04/2009    small and large bowel resection    Social History [name]   reports that she has been smoking Cigarettes.  She has been smoking about 1.00 pack per day. She has never used smokeless tobacco. She reports that she does not drink alcohol or use illicit drugs.  family history includes Alcohol abuse in her father; Breast cancer in her cousin; Cancer in her paternal grandfather; Cervical cancer in her maternal grandmother; Colitis in her sister; Crohn's disease in her brother and father; Diabetes in her cousin and father; Heart disease in her father; Hyperlipidemia in her father; Other in her father; Prostate cancer in her paternal grandfather. There is no history of Colon cancer.  No Known Allergies     PHYSICAL EXAMINATION: Vital signs: BP 124/64 mmHg  Pulse 84  Ht 5' 8"  (1.727 m)  Wt 147 lb (66.679 kg)  BMI 22.36 kg/m2  Constitutional: generally well-appearing, no acute distress Psychiatric: alert and oriented x3, cooperative Eyes: extraocular movements intact, anicteric, conjunctiva pink Mouth: oral pharynx moist, no lesions Neck: supple no lymphadenopathy Cardiovascular: heart regular rate and rhythm, no murmur Lungs: clear to auscultation bilaterally Abdomen: soft, nontender, nondistended, no obvious ascites, no peritoneal signs, normal bowel sounds, no organomegaly. Colostomy in left lower quadrant Rectal: Ommitted Extremities: no lower extremity edema bilaterally Skin: no lesions on visible extremities Neuro: No focal deficits. No asterixis.    ASSESSMENT:  #1. Complicated Crohn's status post ileal and colonic resection with colostomy. No active  symptoms. On no postoperative therapy. Last colonoscopy 2011 without active disease #2. Ongoing tobacco abuse #3. History of perirectal rectal disease   PLAN:  #1. Laboratories including CBC, comprehensive metabolic panel, C-reactive protein, and B12 level #2. Advised to return to PCP for regular B12 supplementation #3. Stop smoking. The association of tobacco and Crohn's reviewed #4.  Surveillance colonoscopy. We discussed her high risk for recurrent disease and the potential need for therapy #5. Refill colostomy supplies as requested

## 2015-04-11 ENCOUNTER — Ambulatory Visit (AMBULATORY_SURGERY_CENTER): Payer: Medicare Other | Admitting: Internal Medicine

## 2015-04-11 ENCOUNTER — Encounter: Payer: Self-pay | Admitting: Internal Medicine

## 2015-04-11 VITALS — BP 105/66 | HR 65 | Temp 98.5°F | Resp 21 | Ht 68.0 in | Wt 147.0 lb

## 2015-04-11 DIAGNOSIS — D123 Benign neoplasm of transverse colon: Secondary | ICD-10-CM

## 2015-04-11 DIAGNOSIS — Z1211 Encounter for screening for malignant neoplasm of colon: Secondary | ICD-10-CM

## 2015-04-11 DIAGNOSIS — K50914 Crohn's disease, unspecified, with abscess: Secondary | ICD-10-CM | POA: Diagnosis not present

## 2015-04-11 MED ORDER — SODIUM CHLORIDE 0.9 % IV SOLN: 500.0000 mL | INTRAVENOUS | Status: DC

## 2015-04-11 NOTE — Progress Notes (Signed)
Patient awakening,vss,report to rn 

## 2015-04-11 NOTE — Progress Notes (Signed)
Pt. Colostomy bag reattached post procedure. She is passing air and has no complaints.

## 2015-04-11 NOTE — Op Note (Signed)
[place]  [address]. [place], [address]   COLONOSCOPY PROCEDURE REPORT  PATIENT: [name], [name]  MR#: [id number] BIRTHDATE: 18-Feb-1971 , 43  yrs. old GENDER: female ENDOSCOPIST: Eustace Quail, MD REFERRED BY:.  Self / Office PROCEDURE DATE:  04/11/2015 PROCEDURE:   Colonoscopy, surveillance and Colonoscopy with snare polypectomy x 1 First Screening Colonoscopy - Avg.  risk and is 50 yrs.  old or older - No.  Prior Negative Screening - Now for repeat screening. N/A  History of Adenoma - Now for follow-up colonoscopy & has been > or = to 3 yrs.  N/A ASA CLASS:   Class II INDICATIONS:Inflammatory bowel disease of the intestine if more precise diagnosis or determination of the extent / severity of activity of disease will influence immediate / future management and Patient is not applicable for Colorectal Neoplasm Risk Assessment for this procedure.   . Surgical resection portion ileum right colon and colostomy 2010. Last colonoscopy April 2011 with no active disease. Currently on no medication postoperatively MEDICATIONS: Monitored anesthesia care and Propofol 200 mg IV  DESCRIPTION OF PROCEDURE:   After the risks benefits and alternatives of the procedure were thoroughly explained, informed consent was obtained.  The digital rectal exam was not performed. The LB [id number]-H190 [id number]  endoscope was introduced through the descending colostomy and advanced to the ileum. No adverse events experienced.   The quality of the prep was excellent.  (MoviPrep was used)  The instrument was then slowly withdrawn as the colon was fully examined.  COLON FINDINGS: The pediatric colonoscope was passed through the ostomy without resistance.  The scope was advanced to the surgical anastomosis which was unremarkable.  The ileum was intubated for 20 cm and found to be normal except for a rare tiny aphthoid ulcer. The colonic mucosa was normal throughout except for a  diminutive transverse colon polyp which was removed with cold snare and submitted for pathologic analysis.  Retroflexion was not performed. The time to cecum = 2.8 Withdrawal time = 5.7   The scope was withdrawn and the procedure completed. COMPLICATIONS: There were no immediate complications.  ENDOSCOPIC IMPRESSION: The surgical anastomosis which was unremarkable.  The ileum was intubated for 20 cm and found to be normal except for a rare tiny aphthoid ulcer.  The colonic mucosa was normal throughout except for a diminutive transverse colon polyp which was removed with cold snare and submitted for pathologic analysis  RECOMMENDATIONS: 1. Follow up colonoscopy in 5 years 2. Routine office follow-up with Dr. [name] in 1 year  eSigned:  Eustace Quail, MD 04/11/2015 10:22 AM   cc: The Patient

## 2015-04-11 NOTE — Patient Instructions (Signed)
YOU HAD AN ENDOSCOPIC PROCEDURE TODAY AT [facility]:   Refer to the procedure report that was given to you for any specific questions about what was found during the examination.  If the procedure report does not answer your questions, please call your gastroenterologist to clarify.  If you requested that your care partner not be given the details of your procedure findings, then the procedure report has been included in a sealed envelope for you to review at your convenience later.  YOU SHOULD EXPECT: Some feelings of bloating in the abdomen. Passage of more gas than usual.  Walking can help get rid of the air that was put into your GI tract during the procedure and reduce the bloating. If you had a lower endoscopy (such as a colonoscopy or flexible sigmoidoscopy) you may notice spotting of blood in your stool or on the toilet paper. If you underwent a bowel prep for your procedure, you may not have a normal bowel movement for a few days.  Please Note:  You might notice some irritation and congestion in your nose or some drainage.  This is from the oxygen used during your procedure.  There is no need for concern and it should clear up in a day or so.  SYMPTOMS TO REPORT IMMEDIATELY:   Following lower endoscopy (colonoscopy or flexible sigmoidoscopy):  Excessive amounts of blood in the stool  Significant tenderness or worsening of abdominal pains  Swelling of the abdomen that is new, acute  Fever of 100F or higher    For urgent or emergent issues, a gastroenterologist can be reached at any hour by calling [phone].   DIET: Your first meal following the procedure should be a small meal and then it is ok to progress to your normal diet. Heavy or fried foods are harder to digest and may make you feel nauseous or bloated.  Likewise, meals heavy in dairy and vegetables can increase bloating.  Drink plenty of fluids but you should avoid alcoholic beverages for 24  hours.  ACTIVITY:  You should plan to take it easy for the rest of today and you should NOT DRIVE or use heavy machinery until tomorrow (because of the sedation medicines used during the test).    FOLLOW UP: Our staff will call the number listed on your records the next business day following your procedure to check on you and address any questions or concerns that you may have regarding the information given to you following your procedure. If we do not reach you, we will leave a message.  However, if you are feeling well and you are not experiencing any problems, there is no need to return our call.  We will assume that you have returned to your regular daily activities without incident.  If any biopsies were taken you will be contacted by phone or by letter within the next 1-3 weeks.  Please call us at [phone] if you have not heard about the biopsies in 3 weeks.    SIGNATURES/CONFIDENTIALITY: You and/or your care partner have signed paperwork which will be entered into your electronic medical record.  These signatures attest to the fact that that the information above on your After Visit Summary has been reviewed and is understood.  Full responsibility of the confidentiality of this discharge information lies with you and/or your care-partner.  Follow-up in one year with Dr. [name].  Next colonoscopy 5 years.

## 2015-04-11 NOTE — Progress Notes (Signed)
Called to room to assist during endoscopic procedure.  Patient ID and intended procedure confirmed with present staff. Received instructions for my participation in the procedure from the performing physician.  

## 2015-04-12 ENCOUNTER — Telehealth: Payer: Self-pay | Admitting: *Deleted

## 2015-04-12 NOTE — Telephone Encounter (Signed)
  Follow up Call-  Call back number 04/11/2015  Post procedure Call Back phone  # [phone]  Permission to leave phone message Yes     Patient questions:  Do you have a fever, pain , or abdominal swelling? No. Pain Score  0 *  Have you tolerated food without any problems? Yes.    Have you been able to return to your normal activities? Yes.    Do you have any questions about your discharge instructions: Diet   No. Medications  No. Follow up visit  No.  Do you have questions or concerns about your Care? No.  Actions: * If pain score is 4 or above: No action needed, pain <4.

## 2015-04-17 ENCOUNTER — Encounter: Payer: Self-pay | Admitting: Internal Medicine

## 2016-02-19 ENCOUNTER — Encounter: Payer: Self-pay | Admitting: Internal Medicine

## 2016-06-19 ENCOUNTER — Encounter: Payer: Self-pay | Admitting: Internal Medicine

## 2016-06-19 ENCOUNTER — Ambulatory Visit (INDEPENDENT_AMBULATORY_CARE_PROVIDER_SITE_OTHER): Payer: Medicare Other | Admitting: Internal Medicine

## 2016-06-19 ENCOUNTER — Other Ambulatory Visit: Payer: Self-pay

## 2016-06-19 VITALS — BP 114/76 | HR 80 | Ht 66.0 in | Wt 147.4 lb

## 2016-06-19 DIAGNOSIS — K5010 Crohn's disease of large intestine without complications: Secondary | ICD-10-CM

## 2016-06-19 DIAGNOSIS — E538 Deficiency of other specified B group vitamins: Secondary | ICD-10-CM | POA: Diagnosis not present

## 2016-06-19 DIAGNOSIS — Z716 Tobacco abuse counseling: Secondary | ICD-10-CM

## 2016-06-19 DIAGNOSIS — R1031 Right lower quadrant pain: Secondary | ICD-10-CM | POA: Diagnosis not present

## 2016-06-19 DIAGNOSIS — K9400 Colostomy complication, unspecified: Secondary | ICD-10-CM

## 2016-06-19 MED ORDER — HYOSCYAMINE SULFATE 0.125 MG SL SUBL: SUBLINGUAL_TABLET | SUBLINGUAL | Status: DC

## 2016-06-19 NOTE — Patient Instructions (Addendum)
You have been scheduled for a CT scan of the abdomen and pelvis at [facility] ([address]---this is in the same building as [facility]).   You are scheduled on 06/26/2016 at 1:00pm. You should arrive 15 minutes prior to your appointment time for registration. Please follow the written instructions below on the day of your exam:  WARNING: IF YOU ARE ALLERGIC TO IODINE/X-RAY DYE, PLEASE NOTIFY RADIOLOGY IMMEDIATELY AT [phone]! YOU WILL BE GIVEN A 13 HOUR PREMEDICATION PREP.  1) Do not eat or drink anything after 9:00am (4 hours prior to your test) 2) You have been given 2 bottles of oral contrast to drink. The solution may taste  better if refrigerated, but do NOT add ice or any other liquid to this solution. Shake well before drinking.    Drink 1 bottle of contrast @ 11:00am (2 hours prior to your exam)  Drink 1 bottle of contrast @ 12:00pm (1 hour prior to your exam)  You may take any medications as prescribed with a small amount of water except for the following: Metformin, Glucophage, Glucovance, Avandamet, Riomet, Fortamet, Actoplus Met, Janumet, Glumetza or Metaglip. The above medications must be held the day of the exam AND 48 hours after the exam.  The purpose of you drinking the oral contrast is to aid in the visualization of your intestinal tract. The contrast solution may cause some diarrhea. Before your exam is started, you will be given a small amount of fluid to drink. Depending on your individual set of symptoms, you may also receive an intravenous injection of x-ray contrast/dye. Plan on being at [facility] for 30 minutes or long, depending on the type of exam you are having performed.  If you have any questions regarding your exam or if you need to reschedule, you may call the CT department at [phone] between the hours of 8:00 am and 5:00 pm, Monday-Friday.  We have sent the following medications to your pharmacy for you to pick up at your  convenience:  Levsin   ________________________________________________________________________   

## 2016-06-19 NOTE — Progress Notes (Signed)
HISTORY OF PRESENT ILLNESS:  [name] is a 45 y.o. female with complicated Crohn's disease for which she underwent partial resection of the ileum and colon with subsequent colostomy in May 2010. Also a history of perirectal abscess, B12 deficiency, chronic tobacco abuse, and anxiety/depression. She has demonstrated suboptimal medical compliance. She was last evaluated 03/28/2015. See that dictation for details. At that time the patient continued to have problems with her stoma. She continued to smoke. Laboratories looked good except for low B12 level. She was told to have this addressed with her PCP. She tells me she has no PCP. She also needs a gynecologist and dentist. She did undergo colonoscopy 04/11/2015. The surgical anastomosis was unremarkable. The ileum revealed a rare tiny aphthoid ulcer. The colonic mucosa was normal except for diminutive transverse colon polyp which was removed and found to be tubular adenoma. Office follow-up in 1 year and colonoscopy in 5 years recommended. She presents today for follow-up. Her chief complaint is right lower quadrant pain since December. Worse until passing contents through her ostomy. No associated nausea, vomiting, or weight loss. No bleeding. She takes Tylenol for pain. Can occur several times per day. This limits her activities. Worse with certain movements. She continues to smoke. She requests filling out a disability form  REVIEW OF SYSTEMS:  All non-GI ROS negative except for depression, fatigue, headaches  Past Medical History  Diagnosis Date  . Crohn's disease ([place])     Large and small intestine - Followed by Dr. [name]  . Vitamin B12 deficiency     185 (211-911 normal) in 12/10  . Perirectal abscess 4/10    Treated with antibiotics.   . Enteritis (regional)     small and large intestine  . B12 deficiency   . Perirectal abscess   . IBS (irritable bowel syndrome)   . Fatigue   . Family history of breast cancer     1st cousin  .  Depression 12/18/11    pt states "its not worth living anymore"     Past Surgical History  Procedure Laterality Date  . Rectal fistulotomy      X2  . Colon resection  May 2010    Partial resection of ileum and colon now with colostomy  bag  . Rectal fistulotomy    . Colon surgery      resection  . Abscess removal  1991 and 2010  . Small intestine surgery  04/2009    small and large bowel resection    Social History [name]  reports that she has been smoking Cigarettes.  She has been smoking about 1.00 pack per day. She has never used smokeless tobacco. She reports that she does not drink alcohol or use illicit drugs.  family history includes Alcohol abuse in her father; Breast cancer in her cousin; Cancer in her paternal grandfather; Cervical cancer in her maternal grandmother; Colitis in her sister; Crohn's disease in her brother and father; Diabetes in her cousin and father; Heart disease in her father; Hyperlipidemia in her father; Other in her father; Prostate cancer in her paternal grandfather. There is no history of Colon cancer.  No Known Allergies     PHYSICAL EXAMINATION: Vital signs: BP 114/76 mmHg  Pulse 80  Ht 5' 6"  (1.676 m)  Wt 147 lb 6 oz (66.849 kg)  BMI 23.80 kg/m2  LMP 06/12/2016  Constitutional: generally well-appearing, no acute distress Psychiatric: alert and oriented x3, cooperative Eyes: extraocular movements intact, anicteric, conjunctiva pink Mouth:  oral pharynx moist, no lesions. Poor dentition Neck: supple no lymphadenopathy Cardiovascular: heart regular rate and rhythm, no murmur Lungs: clear to auscultation bilaterally Abdomen: soft, mild right lower quadrant tenderness with slight fullness, nondistended, no obvious ascites, no peritoneal signs, normal bowel sounds, no organomegaly. Colostomy in left lower quadrant Rectal:Omitted. Extremities: no clubbing cyanosis or lower extremity edema bilaterally Skin: no lesions on visible  extremities Neuro: No focal deficits. Cranial nerves intact  ASSESSMENT:  #1. Complicated Crohn's disease with surgery as described #2. Six-month history of right lower quadrant pain. Rule out recurrent Crohn's #3. Chronic colostomy with intermittent discomfort #4. Chronic tobacco abuse. Ongoing #5. History of medical noncompliance #6. History of B12 deficiency. Not clear she is getting replacement #7. Needs to establish relationships with other physicians as previously instructed #8. Requests disability form to be filled out   PLAN:  #1. CBC, comprehensive metabolic panel, and C-reactive protein #2. CT scan of the abdomen and pelvis #3. Prescribed Levsin sublingual as needed for pain #4. Filled out disability form #5. Needs to obtain PCP and GYN ASAP #6. If no significant abnormalities on the above workup, GI follow-up one year #7. Routine colonoscopy planned around 2021. Sooner if clinically indicated #8. Stop smoking!!!  40 minutes spent face-to-face with the patient. Greater than 50% a time use for counseling regarding her Crohn's disease, medical monitoring, surveillance, medical compliance, and the need for PCP

## 2016-06-20 ENCOUNTER — Other Ambulatory Visit (INDEPENDENT_AMBULATORY_CARE_PROVIDER_SITE_OTHER): Payer: Medicare Other

## 2016-06-20 DIAGNOSIS — K5010 Crohn's disease of large intestine without complications: Secondary | ICD-10-CM | POA: Diagnosis not present

## 2016-06-20 LAB — COMPREHENSIVE METABOLIC PANEL
ALBUMIN: 4.1 g/dL (ref 3.5–5.2)
ALK PHOS: 45 U/L (ref 39–117)
ALT: 8 U/L (ref 0–35)
AST: 10 U/L (ref 0–37)
BILIRUBIN TOTAL: 0.7 mg/dL (ref 0.2–1.2)
BUN: 6 mg/dL (ref 6–23)
CALCIUM: 9.7 mg/dL (ref 8.4–10.5)
CHLORIDE: 102 meq/L (ref 96–112)
CO2: 31 mEq/L (ref 19–32)
CREATININE: 0.73 mg/dL (ref 0.40–1.20)
GFR: 90.91 mL/min (ref >60.00)
Glucose, Bld: 111 mg/dL — ABNORMAL HIGH (ref 70–99)
Potassium: 3.9 mEq/L (ref 3.5–5.1)
SODIUM: 137 meq/L (ref 135–145)
TOTAL PROTEIN: 6.7 g/dL (ref 6.0–8.3)

## 2016-06-20 LAB — SEDIMENTATION RATE: SED RATE: 4 mm/h (ref 0–20)

## 2016-06-20 LAB — CBC WITH DIFFERENTIAL/PLATELET
BASOS PCT: 0.3 % (ref 0.0–3.0)
Basophils Absolute: 0 10*3/uL (ref 0.0–0.1)
EOS ABS: 0.1 10*3/uL (ref 0.0–0.7)
Eosinophils Relative: 1 % (ref 0.0–5.0)
HEMATOCRIT: 40.1 % (ref 36.0–46.0)
HEMOGLOBIN: 13.6 g/dL (ref 12.0–15.0)
LYMPHS PCT: 30.7 % (ref 12.0–46.0)
Lymphs Abs: 2.3 10*3/uL (ref 0.7–4.0)
MCHC: 33.9 g/dL (ref 30.0–36.0)
MCV: 95.9 fl (ref 78.0–100.0)
MONO ABS: 0.4 10*3/uL (ref 0.1–1.0)
Monocytes Relative: 5.2 % (ref 3.0–12.0)
Neutro Abs: 4.6 10*3/uL (ref 1.4–7.7)
Neutrophils Relative %: 62.8 % (ref 43.0–77.0)
Platelets: 248 10*3/uL (ref 150.0–400.0)
RBC: 4.18 Mil/uL (ref 3.87–5.11)
RDW: 13.8 % (ref 11.5–15.5)
WBC: 7.4 10*3/uL (ref 4.0–10.5)

## 2016-06-20 LAB — VITAMIN B12: VITAMIN B 12: 176 pg/mL — AB (ref 211–911)

## 2016-06-26 ENCOUNTER — Ambulatory Visit (INDEPENDENT_AMBULATORY_CARE_PROVIDER_SITE_OTHER)
Admission: RE | Admit: 2016-06-26 | Discharge: 2016-06-26 | Disposition: A | Discharge status: Home / Self Care | Payer: Medicare Other | Source: Ambulatory Visit | Attending: Internal Medicine | Admitting: Internal Medicine

## 2016-06-26 DIAGNOSIS — K5010 Crohn's disease of large intestine without complications: Secondary | ICD-10-CM

## 2016-06-26 DIAGNOSIS — R1031 Right lower quadrant pain: Secondary | ICD-10-CM | POA: Diagnosis not present

## 2016-06-26 MED ORDER — IOPAMIDOL (ISOVUE-300) INJECTION 61%
100.0000 mL | Freq: Once | INTRAVENOUS | Status: AC | PRN
  Administered 2016-06-26: 100 mL via INTRAVENOUS

## 2016-07-07 ENCOUNTER — Telehealth: Payer: Self-pay

## 2016-07-07 ENCOUNTER — Other Ambulatory Visit: Payer: Self-pay

## 2016-07-07 MED ORDER — DICYCLOMINE HCL 10 MG PO CAPS: 10.0000 mg | ORAL_CAPSULE | ORAL | Status: DC | PRN

## 2016-07-07 MED ORDER — DICYCLOMINE HCL 10 MG PO CAPS: 10.0000 mg | ORAL_CAPSULE | Freq: Three times a day (TID) | ORAL | Status: DC

## 2016-07-07 MED ORDER — CYANOCOBALAMIN 1000 MCG/ML IJ SOLN: INTRAMUSCULAR | Status: DC

## 2016-07-07 NOTE — Telephone Encounter (Signed)
Script sent to pharmacy. Pt aware.

## 2016-07-07 NOTE — Telephone Encounter (Signed)
Pt states the Levsin that Dr. [name] prescribed is not covered by [facility]. Requesting something different be sent to the pharmacy for her. Please advise.

## 2016-07-07 NOTE — Telephone Encounter (Signed)
Bentyl 10 mg every 4 hours when necessary

## 2016-07-09 ENCOUNTER — Telehealth: Payer: Self-pay | Admitting: Internal Medicine

## 2016-07-09 NOTE — Telephone Encounter (Signed)
Noted  

## 2016-07-09 NOTE — Telephone Encounter (Signed)
I transferred her to our 1st floor primary care to schedule an appt.

## 2016-08-21 ENCOUNTER — Other Ambulatory Visit: Payer: Self-pay | Admitting: Internal Medicine

## 2016-11-12 ENCOUNTER — Telehealth: Payer: Self-pay | Admitting: Internal Medicine

## 2016-11-13 NOTE — Telephone Encounter (Signed)
Faxed the request back to them saying that they needed to send that info to patient's PCP.  Dr. [name] does not handle this.

## 2017-08-03 ENCOUNTER — Telehealth: Payer: Self-pay | Admitting: Internal Medicine

## 2017-08-04 ENCOUNTER — Telehealth: Payer: Self-pay | Admitting: Internal Medicine

## 2017-08-04 NOTE — Telephone Encounter (Signed)
Left message with Cassie that I needed the form resent as I never received it.

## 2017-08-04 NOTE — Telephone Encounter (Signed)
Spoke with pt and she states that Dr. [name] has been prescribing her ostomy supplies and she is almost out. Discussed with pt that Dr. [name] does not routinely order these. Pt states he has been doing it for the past 7 years. Per scanned in documents in media the supplies have been ordered by Dr. Excell Seltzer with [facility]. Pt aware and knows to call that office.

## 2017-09-29 ENCOUNTER — Ambulatory Visit (INDEPENDENT_AMBULATORY_CARE_PROVIDER_SITE_OTHER): Payer: Medicare Other | Admitting: Internal Medicine

## 2017-09-29 ENCOUNTER — Encounter: Payer: Self-pay | Admitting: Internal Medicine

## 2017-09-29 ENCOUNTER — Other Ambulatory Visit (INDEPENDENT_AMBULATORY_CARE_PROVIDER_SITE_OTHER): Payer: Medicare Other

## 2017-09-29 ENCOUNTER — Encounter (INDEPENDENT_AMBULATORY_CARE_PROVIDER_SITE_OTHER): Payer: Self-pay

## 2017-09-29 VITALS — BP 130/60 | HR 70 | Ht 68.0 in | Wt 137.0 lb

## 2017-09-29 DIAGNOSIS — N949 Unspecified condition associated with female genital organs and menstrual cycle: Secondary | ICD-10-CM

## 2017-09-29 DIAGNOSIS — E538 Deficiency of other specified B group vitamins: Secondary | ICD-10-CM

## 2017-09-29 DIAGNOSIS — K5080 Crohn's disease of both small and large intestine without complications: Secondary | ICD-10-CM

## 2017-09-29 DIAGNOSIS — N9489 Other specified conditions associated with female genital organs and menstrual cycle: Secondary | ICD-10-CM

## 2017-09-29 DIAGNOSIS — R1031 Right lower quadrant pain: Secondary | ICD-10-CM | POA: Diagnosis not present

## 2017-09-29 LAB — COMPREHENSIVE METABOLIC PANEL
ALK PHOS: 46 U/L (ref 39–117)
ALT: 8 U/L (ref 0–35)
AST: 9 U/L (ref 0–37)
Albumin: 4.4 g/dL (ref 3.5–5.2)
BUN: 7 mg/dL (ref 6–23)
CALCIUM: 9.6 mg/dL (ref 8.4–10.5)
CO2: 29 meq/L (ref 19–32)
Chloride: 103 mEq/L (ref 96–112)
Creatinine, Ser: 0.67 mg/dL (ref 0.40–1.20)
GFR: 99.83 mL/min (ref >60.00)
Glucose, Bld: 94 mg/dL (ref 70–99)
Potassium: 4.2 mEq/L (ref 3.5–5.1)
Sodium: 136 mEq/L (ref 135–145)
TOTAL PROTEIN: 7.4 g/dL (ref 6.0–8.3)
Total Bilirubin: 1 mg/dL (ref 0.2–1.2)

## 2017-09-29 LAB — CBC WITH DIFFERENTIAL/PLATELET
BASOS ABS: 0 10*3/uL (ref 0.0–0.1)
Basophils Relative: 0.4 % (ref 0.0–3.0)
EOS ABS: 0.1 10*3/uL (ref 0.0–0.7)
Eosinophils Relative: 1 % (ref 0.0–5.0)
HEMATOCRIT: 42.7 % (ref 36.0–46.0)
Hemoglobin: 14.5 g/dL (ref 12.0–15.0)
LYMPHS PCT: 28.7 % (ref 12.0–46.0)
Lymphs Abs: 2.8 10*3/uL (ref 0.7–4.0)
MCHC: 33.9 g/dL (ref 30.0–36.0)
MCV: 99.8 fl (ref 78.0–100.0)
MONOS PCT: 6.8 % (ref 3.0–12.0)
Monocytes Absolute: 0.7 10*3/uL (ref 0.1–1.0)
NEUTROS ABS: 6.1 10*3/uL (ref 1.4–7.7)
NEUTROS PCT: 63.1 % (ref 43.0–77.0)
PLATELETS: 246 10*3/uL (ref 150.0–400.0)
RBC: 4.28 Mil/uL (ref 3.87–5.11)
RDW: 13.6 % (ref 11.5–15.5)
WBC: 9.6 10*3/uL (ref 4.0–10.5)

## 2017-09-29 LAB — VITAMIN B12: Vitamin B-12: 200 pg/mL — ABNORMAL LOW (ref 211–911)

## 2017-09-29 LAB — SEDIMENTATION RATE: Sed Rate: 8 mm/hr (ref 0–20)

## 2017-09-29 NOTE — Patient Instructions (Signed)
Your physician has requested that you go to the basement for lab work before leaving today  You will hear from the [facility] about an appointment

## 2017-09-29 NOTE — Progress Notes (Signed)
HISTORY OF PRESENT ILLNESS:  [name] is a 46 y.o. female with complicated Crohn's disease for which she underwent partial resection of the ileum and colon with subsequent colostomy May 2010. Also a history of perirectal abscess, B12 deficiency, chronic tobacco abuse, suboptimal medical compliance, and anxiety/depression. Last colonoscopy the of the ostomy April 2016. The surgical anastomosis was unremarkable. The ileum revealed rare tiny aphthoid ulcer. Colonic mucosa was normal except for an incidental diminutive tubular adenoma which was removed. She was last seen in this office 06/19/2016. See that dictation. The new complaints at that time was a six-month history of right lower quadrant pain. Blood work was unremarkable except for low B12 level. She was again told to secure PCP and GYN ASAP. Disability forms were filled out. She was advised to stop smoking. A CT scan of the abdomen and pelvis with contrast was ordered and performed 06/26/2016. She was found to have an interval large complex cystic mass in the right adnexal region as well as a large probable exophytic uterine fibroid. A question of rectal enteric fistula raised. She was advised that she needed to see a GYN ASAP. We offered to arrange the appointment but she stated that she would take care of this. She follows back up at this time. Unfortunately, she did not secure a GYN appointment. She has not been able to get into the family practice clinic to obtain a PCP. She continues to smoke. Chief complaint today is ongoing right sided discomfort. She has not been on B12 replacement. She takes over-the-counter analgesics such as Tylenol and naproxen. Since her visit last year she has lost 10 pounds. She denies nausea or vomiting. No blood per ostomy.  REVIEW OF SYSTEMS:  All non-GI ROS negative unless otherwise stated in the history of present illness except for anxiety  Past Medical History:  Diagnosis Date  . B12 deficiency   . Colon  polyp   . Crohn's disease ([place])    Large and small intestine - Followed by Dr. [name]  . Depression 12/18/11   pt states "its not worth living anymore"   . Enteritis (regional)    small and large intestine  . Family history of breast cancer    1st cousin  . Fatigue   . IBS (irritable bowel syndrome)   . Perirectal abscess 4/10   Treated with antibiotics.   . Perirectal abscess 1991  . Vitamin B12 deficiency    185 (211-911 normal) in 12/10    Past Surgical History:  Procedure Laterality Date  . abscess removal  1991 and 2010  . COLON RESECTION  May 2010   Partial resection of ileum and colon now with colostomy  bag  . COLON SURGERY     resection  . RECTAL FISTULOTOMY     X2  . rectal fistulotomy    . SMALL INTESTINE SURGERY  04/2009   small and large bowel resection    Social History [name]  reports that she has been smoking Cigarettes.  She has been smoking about 1.00 pack per day. She has never used smokeless tobacco. She reports that she does not drink alcohol or use drugs.  family history includes Alcohol abuse in her father; Breast cancer in her cousin; Cervical cancer in her maternal grandmother; Colitis in her sister; Crohn's disease in her father, mother, and sister; Diabetes in her cousin and father; Heart disease in her father; Hyperlipidemia in her father; Hypertension in her father; Osteoporosis in her mother; Ovarian cancer  in her maternal grandmother; Prostate cancer in her paternal grandfather.  No Known Allergies     PHYSICAL EXAMINATION: Vital signs: BP 130/60   Pulse 70   Ht 5' 8"  (1.727 m)   Wt 137 lb (62.1 kg)   BMI 20.83 kg/m   Constitutional: generally well-appearing, no acute distress Psychiatric: alert and oriented x3, cooperative Eyes: extraocular movements intact, anicteric, conjunctiva pink Mouth: oral pharynx moist, no lesions Neck: supple no lymphadenopathy Cardiovascular: heart regular rate and rhythm, no murmur Lungs: clear  to auscultation bilaterally Abdomen: soft, fullness and discomfort in the right lower quadrant , nondistended, no obvious ascites, no peritoneal signs, normal bowel sounds, no organomegaly Rectal: Omitted Extremities: no clubbing cyanosis or lower extremity edema bilaterally Skin: no lesions on visible extremities Neuro: No focal deficits. Cranial nerves intact  ASSESSMENT:  #1. Complicated Crohn's disease with surgery as described. Last colonoscopy 2016 without active disease. CT scan last year without active disease. Question of perirectal fistula raised #2. Complex cystic mass in the right adnexa. Diagnosed on CT June 2017. The patient has not seen a GYN as we requested. We previously offered to arrange but she declined. #3. Waiting to obtain PCP, still. #4. History of B12 deficiency. Last level low. Has not been on replacement #5. Ongoing tobacco abuse   PLAN:  #1. We had arranged for ASAP GYN referral. She will be contacted by the women's clinic. #2. We have reached out to family practice to expedite her as a patient #3. We will prescribe B12 and initiate replacement therapy in this office until she finds a PCP #4. Laboratories today including comprehensive metabolic panel, CBC, ESR, and B12 level. We will contact the patient with the results #5. Stop smoking! #6. Routine GI follow-up one year. Sooner if needed  45 minutes spent face-to-face with the patient. Greater than 50% a time use for counseling and coordination of care regarding her Crohn's disease, its complications, and her right adnexal/uterine radiologic abnormalities

## 2017-10-01 ENCOUNTER — Other Ambulatory Visit: Payer: Self-pay

## 2017-10-01 MED ORDER — CYANOCOBALAMIN 1000 MCG/ML IJ SOLN: INTRAMUSCULAR | 0 refills | Status: DC

## 2017-10-01 MED ORDER — CYANOCOBALAMIN 1000 MCG/ML IJ SOLN: INTRAMUSCULAR | 2 refills | Status: DC

## 2017-10-04 ENCOUNTER — Encounter: Payer: Self-pay | Admitting: Family Medicine

## 2017-10-04 NOTE — Progress Notes (Signed)
   Subjective:   Patient ID: [name]    DOB: [birth year], 46 y.o. female   MRN: [id number]  [name] is a 46 y.o. female with a history of complicated Crohn's disease, tobacco use disorder, anxiety/depression here to establish care  Lack of PCP - CT scan 05/2016 - interval large complex cystic mass in the right adnexal region as well as a large probable exophytic uterine fibroid. A question of rectal enteric fistula raised. GI arranged GYN appointment to assess. Patient waiting to be contacted for appointment - Low B12 on labs, prescribed B12 by GI until could get appointment  Crohn's Disease - complicated by partial resection of ileum and colon with subsequent colostomy 2010. - Followed by [facility] - chronic RUQ pain, likely due to Crohn's disease - pain at ostomy site with defecation  Healthcare Maintenance - Vaccines: flu, tetanus - declined - Colonoscopy: 03/2015, every 5 years - Mammogram: never had, is due.  - Pap Smear: 01/2009, due 2013 - Smoking cessation - not ready to quit.   Review of Systems:  Per HPI.   [place]: reviewed. Smoking status reviewed. Medications reviewed.  Objective:   BP 110/72   Pulse 80   Temp 98.1 F (36.7 C) (Oral)   Ht 5' 8"  (1.727 m)   Wt 138 lb 3.2 oz (62.7 kg)   LMP 09/23/2017 (Approximate)   SpO2 97%   BMI 21.01 kg/m  Vitals and nursing note reviewed.  General: well nourished, well developed, in no acute distress with non-toxic appearance CV: regular rate and rhythm without murmurs, rubs, or gallops, no lower extremity edema Lungs: clear to auscultation bilaterally with normal work of breathing Abdomen: soft, slightly tender to palpation in RUQ, non-distended, no masses or organomegaly palpable. Ostomy bag in place, site clean and dry Skin: warm, dry, no rashes or lesions Extremities: warm and well perfused, normal tone MSK: Full ROM, strength intact, gait normal Neuro: Alert and oriented, speech normal  Assessment &  Plan:   Tobacco abuse Currently smokes 1 ppd since 1988. Patient stated she is not ready to quit but instructed patient to let us know when she is ready. Will continue to assess readiness. Will provide patient with information on 1-800-QUIT line.  CROHN'S DISEASE, LARGE AND SMALL INTESTINES Follows with Dr. [name] at [place]. S/p ileocolectomy 2010 with ostomy bag placement.  Ostomy site clean and dry with bag in place. Patient endorsing some chronic RUQ pain with initiation of meals but suspect this is related to Crohn's disease. Negative Murphy's sign. No peritoneal signs.  Orders Placed This Encounter  Procedures  . MM Digital Screening    Standing Status:   Future    Standing Expiration Date:   12/05/2018    Order Specific Question:   Reason for Exam (SYMPTOM  OR DIAGNOSIS REQUIRED)    Answer:   screening mammogram    Order Specific Question:   Is the patient pregnant?    Answer:   No    Order Specific Question:   Preferred imaging location?    Answer:   [facility]   No orders of the defined types were placed in this encounter.   Rory Percy, DO PGY-1, [facility] 10/05/2017 12:01 PM

## 2017-10-05 ENCOUNTER — Ambulatory Visit (INDEPENDENT_AMBULATORY_CARE_PROVIDER_SITE_OTHER): Payer: Medicare Other | Admitting: Family Medicine

## 2017-10-05 ENCOUNTER — Encounter: Payer: Self-pay | Admitting: Family Medicine

## 2017-10-05 VITALS — BP 110/72 | HR 80 | Temp 98.1°F | Ht 68.0 in | Wt 138.2 lb

## 2017-10-05 DIAGNOSIS — K50818 Crohn's disease of both small and large intestine with other complication: Secondary | ICD-10-CM

## 2017-10-05 DIAGNOSIS — Z720 Tobacco use: Secondary | ICD-10-CM | POA: Diagnosis not present

## 2017-10-05 DIAGNOSIS — Z1231 Encounter for screening mammogram for malignant neoplasm of breast: Secondary | ICD-10-CM | POA: Diagnosis not present

## 2017-10-05 DIAGNOSIS — Z7689 Persons encountering health services in other specified circumstances: Secondary | ICD-10-CM | POA: Diagnosis not present

## 2017-10-05 NOTE — Patient Instructions (Signed)
It was great to meet you!  We are happy to provide care for you!  - For your Vitamin B12 deficiency, we can give you the shots if you prefer - just bring the medication with you to the clinic and one of our nurses can give that shot to you. No need for an appointment. - If the OB/GYN office has not called you by next week, you should call them to check on the status of your referral. You are welcome to receive Pap smears here at our office or at their office since you will be establishing with them anyway, up to you. - Whenever you are ready to quit smoking, let us know and we will be more than happy to assist you.  Take care and seek immediate care sooner if you develop any concerns.   Rory Percy, DO [facility]

## 2017-10-05 NOTE — Assessment & Plan Note (Signed)
Currently smokes 1 ppd since 1988. Patient stated she is not ready to quit but instructed patient to let us know when she is ready. Will continue to assess readiness. Will provide patient with information on 1-800-QUIT line.

## 2017-10-05 NOTE — Assessment & Plan Note (Signed)
Follows with Dr. [name] at [place]. S/p ileocolectomy 2010 with ostomy bag placement.  Ostomy site clean and dry with bag in place. Patient endorsing some chronic RUQ pain with initiation of meals but suspect this is related to Crohn's disease. Negative Murphy's sign. No peritoneal signs.

## 2017-11-23 ENCOUNTER — Other Ambulatory Visit: Payer: Self-pay | Admitting: Internal Medicine

## 2017-12-17 ENCOUNTER — Encounter: Payer: Medicare Other | Admitting: Obstetrics & Gynecology

## 2018-07-31 ENCOUNTER — Encounter

## 2018-09-15 NOTE — Progress Notes (Signed)
  Subjective:   Patient ID: [name]    DOB: 12/09/1971, 47 y.o. female   MRN: [id number]  [name] is a 47 y.o. female with a history of Crohn's disease with colostomy, B12 deficiency, depression, tobacco use here for   Vitamin B12 deficiency - chronic, IM supplemental injections every 2 weeks. Hasn't had in over a year. Wants to get back on them. - Denies difficulty with ambulation, numbness or tingling, weakness, palpitations, vision changes.  Endorsing fatigue.  Healthcare Maintenance - Vaccines: flu, tetanus - Colonoscopy: UTD - Mammogram: UTD - Pap Smear: due - HIV screening: due  Review of Systems:  Per HPI.  [place], medications and smoking status reviewed.  Objective:   BP 110/70   Pulse (!) 57   Ht 5' 8"  (1.727 m)   Wt 137 lb (62.1 kg)   LMP 09/13/2018 (Exact Date)   SpO2 99%   BMI 20.83 kg/m  Vitals and nursing note reviewed.  General: well nourished, well developed, in no acute distress with non-toxic appearance CV: regular rate and rhythm without murmurs, rubs, or gallops, no lower extremity edema Lungs: clear to auscultation bilaterally with normal work of breathing Abdomen: soft, non-tender, non-distended, normoactive bowel sounds.  Colostomy bag in place. Skin: warm, dry, no rashes or lesions Extremities: warm and well perfused, normal tone MSK: ROM grossly intact, strength intact, gait normal Neuro: Alert and oriented, speech normal  Assessment & Plan:   Vitamin B12 deficiency Secondary to ileocolectomy due to Crohn's disease.  Previously on supplementation but has not had in over a year.  Prior labs consistent with vitamin B12 deficiency.  Will obtain CBC, B12 level today.  Will refill prior supplementation.  Healthcare maintenance Instructed to make appointment for Pap smear.  Declined flu and tetanus vaccines.  Obtained HIV screening today.  Orders Placed This Encounter  Procedures  . CBC  . Vitamin B12  . HIV antibody (with reflex)     Meds ordered this encounter  Medications  . cyanocobalamin (,VITAMIN B-12,) 1000 MCG/ML injection    Sig: Inject 1055mg SQ every 2 weeks for 1 month, then inject 10079m monthly.    Dispense:  8 mL    Refill:  2    Please provide patient with syringes and needles    AlRory PercyDO PGY-2, [facility] 09/17/2018 4:47 PM

## 2018-09-17 ENCOUNTER — Ambulatory Visit (INDEPENDENT_AMBULATORY_CARE_PROVIDER_SITE_OTHER): Payer: Medicare Other | Admitting: Family Medicine

## 2018-09-17 ENCOUNTER — Encounter: Payer: Self-pay | Admitting: Family Medicine

## 2018-09-17 ENCOUNTER — Other Ambulatory Visit: Payer: Self-pay

## 2018-09-17 VITALS — BP 110/70 | HR 57 | Ht 68.0 in | Wt 137.0 lb

## 2018-09-17 DIAGNOSIS — Z114 Encounter for screening for human immunodeficiency virus [HIV]: Secondary | ICD-10-CM

## 2018-09-17 DIAGNOSIS — E538 Deficiency of other specified B group vitamins: Secondary | ICD-10-CM | POA: Diagnosis not present

## 2018-09-17 MED ORDER — CYANOCOBALAMIN 1000 MCG/ML IJ SOLN
INTRAMUSCULAR | 2 refills | Status: DC
Start: 2018-09-17 — End: 2019-01-04

## 2018-09-17 NOTE — Assessment & Plan Note (Signed)
Secondary to ileocolectomy due to Crohn's disease.  Previously on supplementation but has not had in over a year.  Prior labs consistent with vitamin B12 deficiency.  Will obtain CBC, B12 level today.  Will refill prior supplementation.

## 2018-09-17 NOTE — Patient Instructions (Addendum)
It was great to see you!  Our plans for today:  - We are checking some labs today, we will call you with these results. - I refilled your B12 supplementation. You can make a nurse visit to get those injections.  - schedule appointment for pap smear  Take care and seek immediate care sooner if you develop any concerns.   Dr. [facility]

## 2018-09-18 LAB — VITAMIN B12: Vitamin B-12: 235 pg/mL (ref 232–1245)

## 2018-09-18 LAB — CBC
Hematocrit: 40.5 % (ref 34.0–46.6)
Hemoglobin: 14.1 g/dL (ref 11.1–15.9)
MCH: 34.7 pg — ABNORMAL HIGH (ref 26.6–33.0)
MCHC: 34.8 g/dL (ref 31.5–35.7)
MCV: 100 fL — AB (ref 79–97)
PLATELETS: 241 10*3/uL (ref 150–450)
RBC: 4.06 x10E6/uL (ref 3.77–5.28)
RDW: 12 % — AB (ref 12.3–15.4)
WBC: 6.9 10*3/uL (ref 3.4–10.8)

## 2018-09-18 LAB — HIV ANTIBODY (ROUTINE TESTING W REFLEX): HIV SCREEN 4TH GENERATION: NONREACTIVE

## 2018-11-17 ENCOUNTER — Telehealth: Payer: Self-pay | Admitting: *Deleted

## 2018-11-17 NOTE — Telephone Encounter (Signed)
Ok to give verbal consent.

## 2018-11-17 NOTE — Telephone Encounter (Signed)
Received call from [facility] with [employer] to get verbal to send ostomy supplies to patient.  Contacted pt to make sure this was not a scam.  She has used them for years and Dr. [name] was giving the verbal but he wanted her to find a PCP and have them take over ordering supplies.    Will forward to MD for verbal. Jayce Boyko, Salome Spotted, [place]

## 2018-11-18 NOTE — Telephone Encounter (Signed)
Verbal orders given to [facility].  Tiari Andringa,CMA

## 2018-11-29 NOTE — Progress Notes (Signed)
  Subjective:   Patient ID: [name]    DOB: 14-Mar-1971, 47 y.o. female   MRN: [id number]  [name] is a 47 y.o. female with a history of Crohn's disease s/p colectomy w/ colostomy, Vit B12 deficiency, depression, tobacco use here for   Vitamin B12 deficiency - 09/17/18 labs wnl. Previously on supplementation, refill provided at last visit however has been unable to get due to insurance issues. Here today for injection. - h/o crohn's disease with colectomy and colostomy. - denies chest pain, SOB, dizziness  Healthcare Maintenance - Vaccines: tetanus - Colonoscopy: UTD - Pap Smear: due  Review of Systems:  Per HPI.  [place], medications and smoking status reviewed.  Objective:   BP (!) 104/54   Pulse 81   Temp 98.1 F (36.7 C) (Oral)   Ht 5' 8"  (1.727 m)   Wt 137 lb 8 oz (62.4 kg)   LMP 11/10/2018   SpO2 98%   BMI 20.91 kg/m  Vitals and nursing note reviewed.  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 Believe it is reasonable to reinitiate. Injection given today. Will continue to monitor with labs.   Healthcare Maintenance Will make appointment in January for pap smear.  No orders of the defined types were placed in this encounter.  Meds ordered this encounter  Medications  . cyanocobalamin ((VITAMIN B-12)) injection 1,000 mcg    Rory Percy, DO PGY-2, [facility] 11/30/2018 10:09 AM

## 2018-11-30 ENCOUNTER — Encounter: Payer: Self-pay | Admitting: Family Medicine

## 2018-11-30 ENCOUNTER — Other Ambulatory Visit: Payer: Self-pay

## 2018-11-30 ENCOUNTER — Ambulatory Visit (INDEPENDENT_AMBULATORY_CARE_PROVIDER_SITE_OTHER): Payer: Medicare Other | Admitting: Family Medicine

## 2018-11-30 VITALS — BP 104/54 | HR 81 | Temp 98.1°F | Ht 68.0 in | Wt 137.5 lb

## 2018-11-30 DIAGNOSIS — E538 Deficiency of other specified B group vitamins: Secondary | ICD-10-CM

## 2018-11-30 MED ORDER — CYANOCOBALAMIN 1000 MCG/ML IJ SOLN
1000.0000 ug | Freq: Once | INTRAMUSCULAR | Status: AC
  Administered 2018-11-30: 1000 ug via INTRAMUSCULAR

## 2018-11-30 NOTE — Assessment & Plan Note (Deleted)
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 Believe it is reasonable to reinitiate. Injection given today. Will continue to monitor with labs.

## 2018-11-30 NOTE — Assessment & Plan Note (Signed)
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 Believe it is reasonable to reinitiate. Injection given today. Will continue to monitor with labs.

## 2018-11-30 NOTE — Patient Instructions (Signed)
It was great to see you!  Our plans for today:  - We gave your B12 injection today, you will continue to get these monthly. - Make an appointment for your pap smear.  Take care and seek immediate care sooner if you develop any concerns.   Dr. [facility]

## 2019-01-04 ENCOUNTER — Other Ambulatory Visit: Payer: Self-pay

## 2019-01-04 DIAGNOSIS — E538 Deficiency of other specified B group vitamins: Secondary | ICD-10-CM

## 2019-01-04 MED ORDER — CYANOCOBALAMIN 1000 MCG/ML IJ SOLN
1000.0000 ug | INTRAMUSCULAR | Status: DC
  Administered 2019-01-05 – 2020-02-01 (×8): 1000 ug via INTRAMUSCULAR

## 2019-01-04 NOTE — Progress Notes (Signed)
Standing order for Vitamin B12 monthly added to medication list.  Danley Danker, RN [facility] RN)

## 2019-01-05 ENCOUNTER — Ambulatory Visit (INDEPENDENT_AMBULATORY_CARE_PROVIDER_SITE_OTHER): Payer: Medicare Other | Admitting: *Deleted

## 2019-01-05 DIAGNOSIS — E538 Deficiency of other specified B group vitamins: Secondary | ICD-10-CM

## 2019-01-05 NOTE — Progress Notes (Signed)
Pt is here for b12 injection today.  Last one was 11/30/18.  Given in left deltoid, pt tolerated well. Next appt made and pt given a reminder card. Caitland Porchia, Salome Spotted, CMA

## 2019-02-07 ENCOUNTER — Ambulatory Visit (INDEPENDENT_AMBULATORY_CARE_PROVIDER_SITE_OTHER): Payer: Medicare Other | Admitting: *Deleted

## 2019-02-07 DIAGNOSIS — E538 Deficiency of other specified B group vitamins: Secondary | ICD-10-CM

## 2019-02-07 NOTE — Progress Notes (Signed)
Pt is here for a B12 injection. Pt tolerated well.  Last injection: 01/05/19  Next injection (appt): 03/09/19. Marget Outten, Salome Spotted, CMA

## 2019-03-09 ENCOUNTER — Other Ambulatory Visit: Payer: Self-pay

## 2019-03-09 ENCOUNTER — Ambulatory Visit (INDEPENDENT_AMBULATORY_CARE_PROVIDER_SITE_OTHER): Payer: Medicare Other | Admitting: *Deleted

## 2019-03-09 DIAGNOSIS — E538 Deficiency of other specified B group vitamins: Secondary | ICD-10-CM

## 2019-03-09 NOTE — Progress Notes (Signed)
Pt is here for a b12 injection today.    Date of last office visit that b12 was discussed 11/30/18  Last injection was 02/07/19  Injection given in right deltoid, pt tolerated well and next appt scheduled for 04/07/19. Eliazer Hemphill, Salome Spotted, CMA

## 2019-04-07 ENCOUNTER — Other Ambulatory Visit: Payer: Self-pay

## 2019-04-07 ENCOUNTER — Ambulatory Visit (INDEPENDENT_AMBULATORY_CARE_PROVIDER_SITE_OTHER): Payer: Medicare Other

## 2019-04-07 DIAGNOSIS — E538 Deficiency of other specified B group vitamins: Secondary | ICD-10-CM | POA: Diagnosis present

## 2019-04-07 MED ORDER — CYANOCOBALAMIN 1000 MCG/ML IJ SOLN
1000.0000 ug | Freq: Once | INTRAMUSCULAR | Status: AC
  Administered 2019-04-07: 10:00:00 1000 ug via INTRAMUSCULAR

## 2019-04-07 NOTE — Progress Notes (Signed)
Pt presents in nurse clinic for monthly B12 injection. Injection given LD, site unremarkable.

## 2019-05-09 ENCOUNTER — Ambulatory Visit (INDEPENDENT_AMBULATORY_CARE_PROVIDER_SITE_OTHER): Payer: Medicare Other

## 2019-05-09 ENCOUNTER — Other Ambulatory Visit: Payer: Self-pay

## 2019-05-09 DIAGNOSIS — E538 Deficiency of other specified B group vitamins: Secondary | ICD-10-CM | POA: Diagnosis not present

## 2019-05-09 MED ORDER — CYANOCOBALAMIN 1000 MCG/ML IJ SOLN
1000.0000 ug | Freq: Once | INTRAMUSCULAR | Status: AC
  Administered 2019-05-09: 1000 ug via INTRAMUSCULAR

## 2019-05-09 NOTE — Progress Notes (Signed)
Pt presents in nurse clinic for monthly b12 injection. Injection given without complication, RD. Pt to schedule next injection on her way out.

## 2019-06-09 ENCOUNTER — Ambulatory Visit (INDEPENDENT_AMBULATORY_CARE_PROVIDER_SITE_OTHER): Payer: Medicare Other

## 2019-06-09 ENCOUNTER — Other Ambulatory Visit: Payer: Self-pay

## 2019-06-09 DIAGNOSIS — E538 Deficiency of other specified B group vitamins: Secondary | ICD-10-CM | POA: Diagnosis present

## 2019-06-09 MED ORDER — CYANOCOBALAMIN 1000 MCG/ML IJ SOLN
1000.0000 ug | Freq: Once | INTRAMUSCULAR | Status: AC
  Administered 2019-06-09: 1000 ug via INTRAMUSCULAR

## 2019-06-09 NOTE — Progress Notes (Signed)
Patient presents in nurse clinic for monthly B12 injection. Injection given LD, site unremarkable. Patient to schedule the next injection on her way out.

## 2019-07-08 ENCOUNTER — Other Ambulatory Visit: Payer: Self-pay

## 2019-07-08 ENCOUNTER — Ambulatory Visit (INDEPENDENT_AMBULATORY_CARE_PROVIDER_SITE_OTHER): Payer: Medicare Other

## 2019-07-08 DIAGNOSIS — E538 Deficiency of other specified B group vitamins: Secondary | ICD-10-CM | POA: Diagnosis present

## 2019-07-08 MED ORDER — CYANOCOBALAMIN 1000 MCG/ML IJ SOLN
1000.0000 ug | Freq: Once | INTRAMUSCULAR | Status: AC
  Administered 2019-07-08: 10:00:00 1000 ug via INTRAMUSCULAR

## 2019-07-08 NOTE — Progress Notes (Signed)
Pt came in and received b12 injection. Given in left deltoild. Pt tolerated injection well. Pt scheduled for next injection 08/08/2019 at 10:30 am. Ottis Stain, [place]

## 2019-08-08 ENCOUNTER — Other Ambulatory Visit: Payer: Self-pay

## 2019-08-08 ENCOUNTER — Ambulatory Visit (INDEPENDENT_AMBULATORY_CARE_PROVIDER_SITE_OTHER): Payer: Medicare Other | Admitting: *Deleted

## 2019-08-08 DIAGNOSIS — E538 Deficiency of other specified B group vitamins: Secondary | ICD-10-CM

## 2019-08-08 NOTE — Progress Notes (Signed)
Pt is here for a b12 injection today.    Date of last office visit that b12 was discussed 11/30/18  Last injection was 07/08/19  Injection given in Right deltoid, pt tolerated well and will stop at front desk to schedule next appt. Christen Bame, CMA

## 2019-09-06 ENCOUNTER — Ambulatory Visit (INDEPENDENT_AMBULATORY_CARE_PROVIDER_SITE_OTHER): Payer: Medicare Other

## 2019-09-06 ENCOUNTER — Other Ambulatory Visit: Payer: Self-pay

## 2019-09-06 DIAGNOSIS — E538 Deficiency of other specified B group vitamins: Secondary | ICD-10-CM | POA: Diagnosis not present

## 2019-09-06 NOTE — Progress Notes (Signed)
Patient presents in nurse clinic for monthly B12 injection. Injection given, RD. Patient tolerated well. Patient informed to make her next apt on the way out. Patient reminded she needs to schedule an apt in December as well.

## 2019-10-06 ENCOUNTER — Ambulatory Visit (INDEPENDENT_AMBULATORY_CARE_PROVIDER_SITE_OTHER): Payer: Medicare Other

## 2019-10-06 ENCOUNTER — Other Ambulatory Visit: Payer: Self-pay

## 2019-10-06 DIAGNOSIS — E538 Deficiency of other specified B group vitamins: Secondary | ICD-10-CM | POA: Diagnosis not present

## 2019-10-06 NOTE — Progress Notes (Signed)
Patient presents in nurse clinic for monthly B12 injection. Injection given, LD. Patient tolerated well. Patient informed to make her next apt on the way out. Patient reminded she needs to schedule an apt in December as well. Ottis Stain, CMA

## 2019-11-07 ENCOUNTER — Ambulatory Visit (INDEPENDENT_AMBULATORY_CARE_PROVIDER_SITE_OTHER): Payer: Medicare Other

## 2019-11-07 ENCOUNTER — Other Ambulatory Visit: Payer: Self-pay

## 2019-11-07 DIAGNOSIS — E538 Deficiency of other specified B group vitamins: Secondary | ICD-10-CM

## 2019-11-07 MED ORDER — CYANOCOBALAMIN 1000 MCG/ML IJ SOLN
1000.0000 ug | Freq: Once | INTRAMUSCULAR | Status: AC
  Administered 2019-11-07: 1000 ug via INTRAMUSCULAR

## 2019-11-07 NOTE — Progress Notes (Signed)
Patient presents in nurse clinic for monthly B12 injection. Injection given RD, site unremarkable.

## 2019-12-07 ENCOUNTER — Other Ambulatory Visit: Payer: Self-pay

## 2019-12-07 ENCOUNTER — Ambulatory Visit (INDEPENDENT_AMBULATORY_CARE_PROVIDER_SITE_OTHER): Payer: Medicare Other

## 2019-12-07 DIAGNOSIS — E538 Deficiency of other specified B group vitamins: Secondary | ICD-10-CM | POA: Diagnosis present

## 2019-12-07 MED ORDER — CYANOCOBALAMIN 1000 MCG/ML IJ SOLN
1000.0000 ug | Freq: Once | INTRAMUSCULAR | Status: AC
  Administered 2019-12-07: 11:00:00 1000 ug via INTRAMUSCULAR

## 2019-12-07 NOTE — Progress Notes (Signed)
Patient presents in nurse clinic for monthly B12 injection. Injection given LD, site unremarkable.

## 2020-01-09 ENCOUNTER — Ambulatory Visit (INDEPENDENT_AMBULATORY_CARE_PROVIDER_SITE_OTHER): Payer: Medicare Other

## 2020-01-09 ENCOUNTER — Other Ambulatory Visit: Payer: Self-pay

## 2020-01-09 DIAGNOSIS — E538 Deficiency of other specified B group vitamins: Secondary | ICD-10-CM | POA: Diagnosis present

## 2020-01-09 NOTE — Progress Notes (Signed)
Pt is here for a b12 injection today.    Date of last office visit that b12 was discussed 11/30/2018  Last injection was 12/07/2019  Injection given in right deltoid, pt tolerated well and scheduled follow-up appointment with Provider 02/01/2020 for annual visit. Talbot Grumbling, RN

## 2020-01-14 ENCOUNTER — Encounter: Payer: Self-pay | Admitting: Family Medicine

## 2020-02-01 ENCOUNTER — Other Ambulatory Visit: Payer: Self-pay

## 2020-02-01 ENCOUNTER — Ambulatory Visit (INDEPENDENT_AMBULATORY_CARE_PROVIDER_SITE_OTHER): Payer: Medicare Other | Admitting: Family Medicine

## 2020-02-01 ENCOUNTER — Encounter: Payer: Self-pay | Admitting: Family Medicine

## 2020-02-01 VITALS — BP 108/66 | HR 79 | Wt 151.8 lb

## 2020-02-01 DIAGNOSIS — N9489 Other specified conditions associated with female genital organs and menstrual cycle: Secondary | ICD-10-CM | POA: Diagnosis not present

## 2020-02-01 DIAGNOSIS — Z23 Encounter for immunization: Secondary | ICD-10-CM

## 2020-02-01 DIAGNOSIS — R1909 Other intra-abdominal and pelvic swelling, mass and lump: Secondary | ICD-10-CM

## 2020-02-01 DIAGNOSIS — K50818 Crohn's disease of both small and large intestine with other complication: Secondary | ICD-10-CM

## 2020-02-01 DIAGNOSIS — E538 Deficiency of other specified B group vitamins: Secondary | ICD-10-CM

## 2020-02-01 DIAGNOSIS — Z720 Tobacco use: Secondary | ICD-10-CM

## 2020-02-01 MED ORDER — CHANTIX STARTING MONTH PAK 0.5 MG X 11 & 1 MG X 42 PO TABS: ORAL_TABLET | ORAL | 0 refills | Status: DC

## 2020-02-01 NOTE — Patient Instructions (Addendum)
It was great to see you!  Our plans for today:  -We are getting a follow-up pelvic ultrasound.  We will call you when we get these results. - We are checking some labs today, we will call you or send you a letter with these results. - Call Dr. [name] for a follow-up appointment. - Take Chantix as directed to help you stop smoking.  Set your quit date for 10-14 days after starting Chantix.  Follow-up in 1 month to see how you are doing. - Call 1-800-QUIT-NOW for free tobacco cessation counseling 24/7.  Take care and seek immediate care sooner if you develop any concerns.   Dr. [facility]

## 2020-02-01 NOTE — Assessment & Plan Note (Signed)
Doing well. Instructed to make follow up appt with Dr. [name] soon.

## 2020-02-01 NOTE — Assessment & Plan Note (Addendum)
Interested in quitting smoking and is working on cutting down.  Will prescribe Chantix.  Quit line hotline provided to patient.  Follow-up in 1 month to assess progress.

## 2020-02-01 NOTE — Assessment & Plan Note (Addendum)
Visualized on CT A/P in 2017, initially obtained due to right lower quadrant abdominal pain related to passing bowel movements and with colostomy.  Currently asymptomatic and without known risk factors for ovarian malignancy.  Has not had any follow-up imaging nor has she seen GYN for follow-up.  Will obtain updated pelvic ultrasound and CA-125 to evaluate for possible malignancy though low likelihood given considerable amount of time since initial imaging and currently asymptomatic.

## 2020-02-01 NOTE — Progress Notes (Signed)
   CHIEF COMPLAINT / HPI:  Healthcare Maintenance - Vaccines: Tdap, flu - Colonoscopy: UTD - Mammogram: Due - Pap Smear: Due, normal 2010  Vitamin B12 deficiency - compliant with IM injections.   Crohn's disease - w/ partial resection of ileum and colon with subsequent colostomy 2010. Follows with [facility], last appt 2018.  Recent COVID infection - has ended quarantine. Doing well.   Adnexal mass on CT A/P in 2017, never followed up with GYN. Denies pain, weight loss. Has developed irregular and lighter menses over the past few years. Unsure when mom went through menopause. LMP 06/2019. Currently sexually active.  Tobacco use - 1ppd for ~40 years. Previously tried gum. Tries to stay busy to distract her self from smoking. Has cut down some. Interested in quitting. Has never tried chantix.  PERTINENT  PMH / PSH: Crohn's disease, depression, tobacco use, vitamin B12 deficiency   OBJECTIVE: BP 108/66   Pulse 79   Wt 151 lb 12.8 oz (68.9 kg)   SpO2 98%   BMI 23.08 kg/m   General: well nourished, well developed, in no acute distress with non-toxic appearance CV: regular rate  Lungs: normal work of breathing Neuro: Alert and oriented, speech normal   ASSESSMENT / PLAN:  CROHN'S DISEASE, LARGE AND SMALL INTESTINES Doing well. Instructed to make follow up appt with Dr. [name] soon.  Vitamin B12 deficiency Doing well on IM injections. Will recheck levels today.  Adnexal mass Visualized on CT A/P in 2017, initially obtained due to right lower quadrant abdominal pain related to passing bowel movements and with colostomy.  Currently asymptomatic and without known risk factors for ovarian malignancy.  Has not had any follow-up imaging nor has she seen GYN for follow-up.  Will obtain updated pelvic ultrasound and CA-125 to evaluate for possible malignancy though low likelihood given considerable amount of time since initial imaging and currently asymptomatic.   Tobacco  abuse Interested in quitting smoking and is working on cutting down.  Will prescribe Chantix.  Quit line hotline provided to patient.  Follow-up in 1 month to assess progress.   Healthcare maintenance Advised to make appointment to update Pap smear.  Flu shot given today.  Rory Percy, [place]

## 2020-02-01 NOTE — Assessment & Plan Note (Signed)
Doing well on IM injections. Will recheck levels today.

## 2020-02-02 LAB — CA 125: Cancer Antigen (CA) 125: 12.6 U/mL (ref 0.0–38.1)

## 2020-02-02 LAB — VITAMIN B12: Vitamin B-12: >2000 pg/mL — ABNORMAL HIGH (ref 232–1245)

## 2020-02-08 ENCOUNTER — Ambulatory Visit
Admission: RE | Admit: 2020-02-08 | Discharge: 2020-02-08 | Disposition: A | Discharge status: Home / Self Care | Payer: Medicare Other | Source: Ambulatory Visit | Attending: Family Medicine | Admitting: Family Medicine

## 2020-02-08 ENCOUNTER — Telehealth: Payer: Self-pay

## 2020-02-08 DIAGNOSIS — N9489 Other specified conditions associated with female genital organs and menstrual cycle: Secondary | ICD-10-CM

## 2020-02-08 NOTE — Telephone Encounter (Signed)
Stacey with [facility] calls report of the following results  IMPRESSION: Unremarkable uterus and endometrial complex with nonvisualization of normal appearing ovaries.  Complex cystic mass RIGHT adnexa 5.3 x 5.8 x 8.0 cm, containing a few scattered internal echoes and questionable segment of wall irregularity; further characterization by MR imaging with and without contrast recommended to exclude cystic ovarian neoplasm.   To PCP and ordering provider (Dr. Nori Riis)  Please advise  Talbot Grumbling, RN

## 2020-02-09 ENCOUNTER — Ambulatory Visit: Payer: Medicare Other

## 2020-02-09 ENCOUNTER — Other Ambulatory Visit: Payer: Self-pay | Admitting: Family Medicine

## 2020-02-09 DIAGNOSIS — N83201 Unspecified ovarian cyst, right side: Secondary | ICD-10-CM

## 2020-02-09 NOTE — Progress Notes (Signed)
Patient with sound showing complex cystic mass of the right adnexa.  Recommended for MRI imaging with and without contrast to exclude cystic ovarian neoplasm.  I called patient and discussed results with her.  She is agreeable to MRI.  I have ordered the MRI.  Blue team can we please schedule this patient for an MRI.  She was not home at the time so did not have her calendar to let me know what dates she is free.  Please call patient back with MRI date and time.  Will route note to blue team to schedule MRI, and Dr. Ky Barban and Dr. Nori Riis as they were ordering providers for [place]  Camillia Marcy, DO, PGY-3 [facility] 02/09/2020 11:56 AM

## 2020-02-09 NOTE — Telephone Encounter (Signed)
I have called patient to inform her of the results.  I have ordered an MRI as well.  Blue team, can you please schedule this MRI for patient. Thanks  Caroline More, DO, PGY-3 [facility] 02/09/2020 11:59 AM

## 2020-02-09 NOTE — Telephone Encounter (Signed)
Patient has been scheduled for 02-21-20 at 8:30am and informed of it.  Reggie Bise,CMA

## 2020-02-21 ENCOUNTER — Other Ambulatory Visit: Payer: Self-pay

## 2020-02-21 ENCOUNTER — Ambulatory Visit (HOSPITAL_COMMUNITY)
Admission: RE | Admit: 2020-02-21 | Discharge: 2020-02-21 | Disposition: A | Discharge status: Home / Self Care | Payer: Medicare Other | Source: Ambulatory Visit | Attending: Family Medicine | Admitting: Family Medicine

## 2020-02-21 DIAGNOSIS — N83201 Unspecified ovarian cyst, right side: Secondary | ICD-10-CM | POA: Diagnosis present

## 2020-02-21 MED ORDER — GADOBUTROL 1 MMOL/ML IV SOLN
6.0000 mL | Freq: Once | INTRAVENOUS | Status: AC | PRN
  Administered 2020-02-21: 6 mL via INTRAVENOUS

## 2020-03-29 ENCOUNTER — Other Ambulatory Visit: Payer: Self-pay | Admitting: Family Medicine

## 2020-03-29 ENCOUNTER — Encounter: Payer: Self-pay | Admitting: Family Medicine

## 2020-03-29 MED ORDER — VARENICLINE TARTRATE 1 MG PO TABS: 1.0000 mg | ORAL_TABLET | Freq: Two times a day (BID) | ORAL | 0 refills | Status: DC

## 2020-11-12 IMAGING — MR MR PELVIS WO/W CM
5 of 11 series · 22 of 48 positions shown · IV contrast (gadavist)
Comparison: Pelvic ultrasound 02/08/2020. Pelvic CT 06/26/2016 and
05/21/2009.

CLINICAL DATA: Adnexal mass on ultrasound. History of perirectal
and left adnexal abscesses.

EXAM:
MRI PELVIS WITHOUT AND WITH CONTRAST
TECHNIQUE: Multiplanar multisequence MR imaging of the pelvis was performed
both before and after administration of intravenous contrast.
CONTRAST:  6mL GADAVIST GADOBUTROL 1 MMOL/ML IV SOLN

[Series 2: T2 · coronal · 6.0mm · 1.56mm/px · 5 of 28 slices shown (1 of 3)]
[im 1/28]
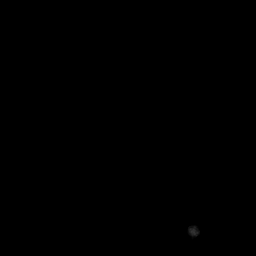
[im 7/28]
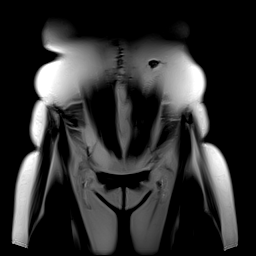
[im 14/28]
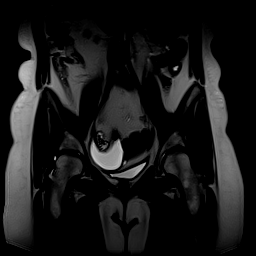
[im 21/28]
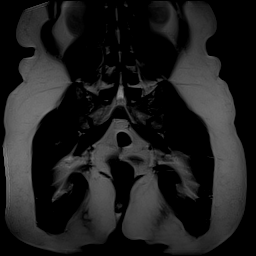
[im 28/28]
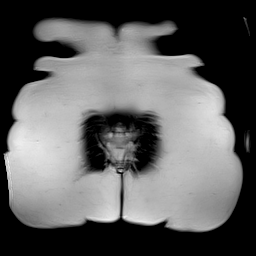

[Series 3: T2 · axial · 5.0mm · 0.51mm/px · z∈[-107,+103]mm · 5 of 36 slices shown (2 of 3)]
[im 1/36]
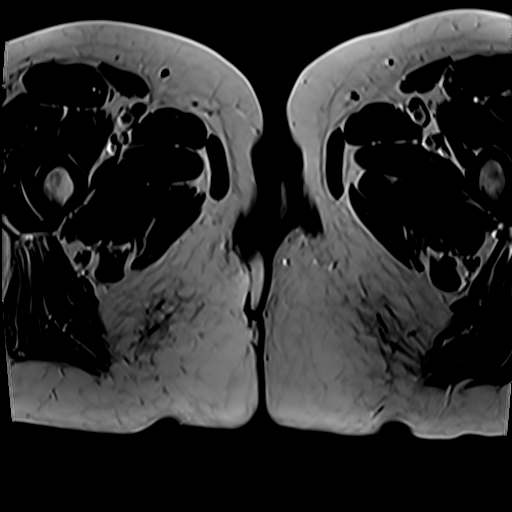
[im 9/36]
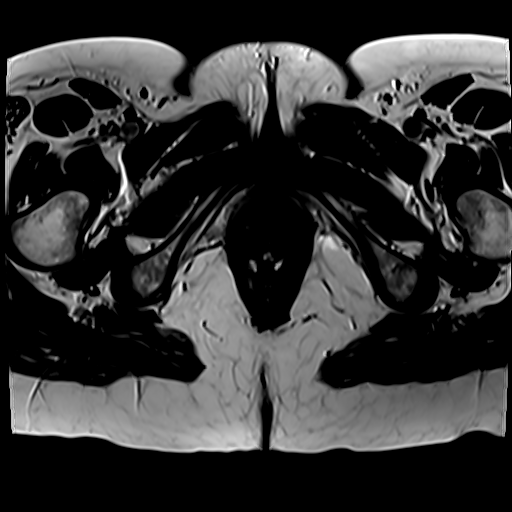
[im 18/36]
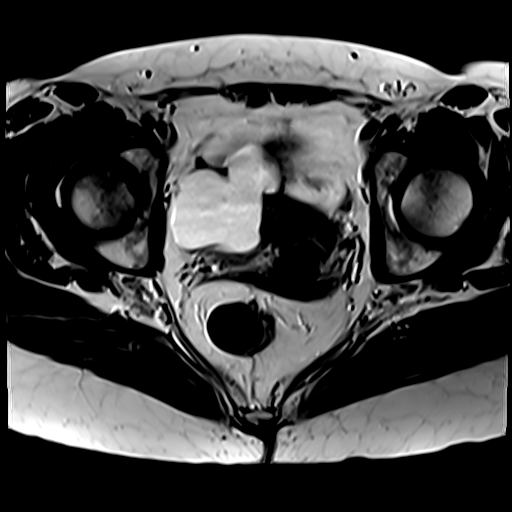
[im 27/36]
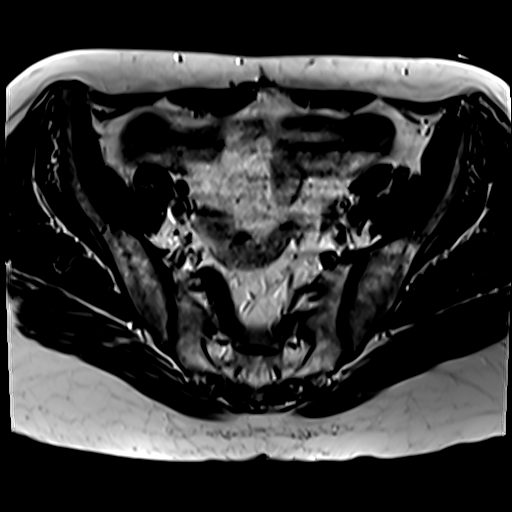
[im 36/36]
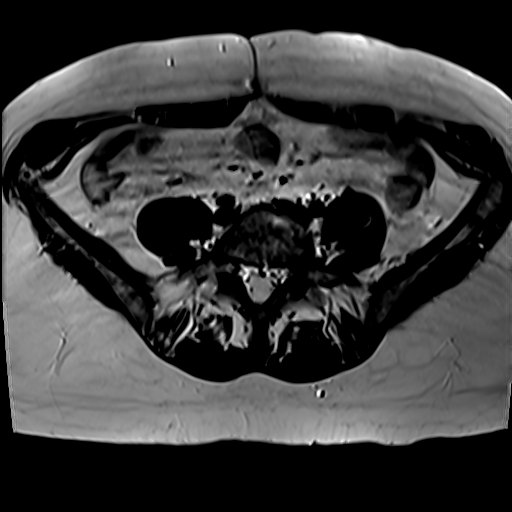

[Series 4: T2 fat-sat · axial · 5.0mm · 0.51mm/px · z∈[-101,+97]mm · 4 of 34 slices shown]
[im 1/34]
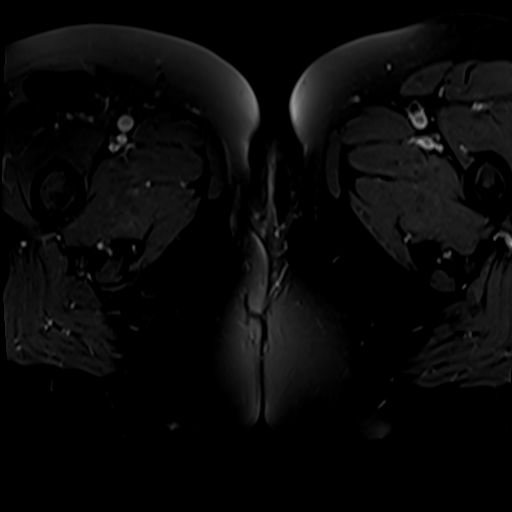
[im 12/34]
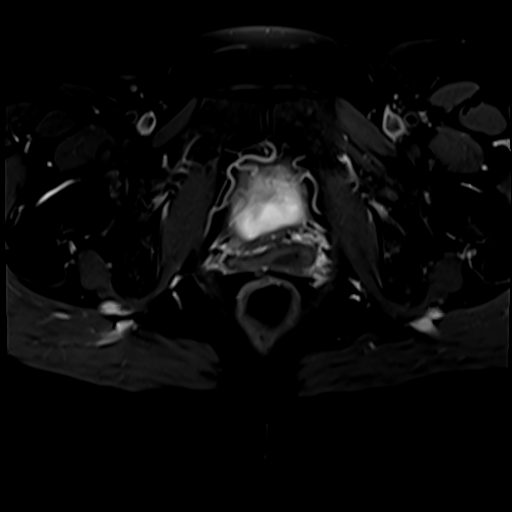
[im 23/34]
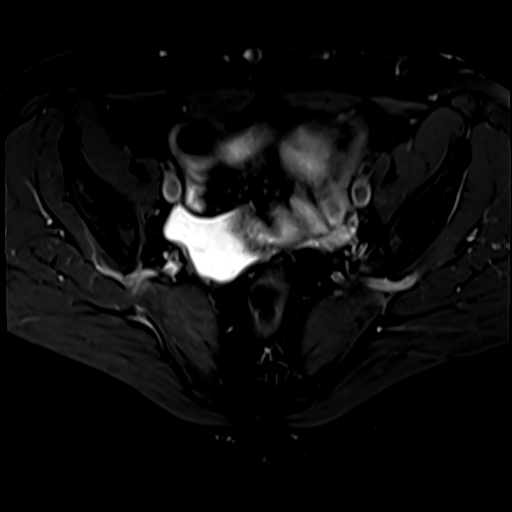
[im 34/34]
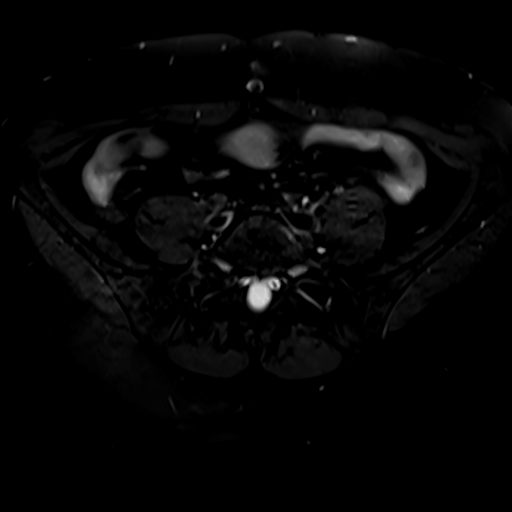

[Series 5: T2 · sagittal · 5.0mm · 0.55mm/px · 5 of 40 slices shown (3 of 3)]
[im 1/40]
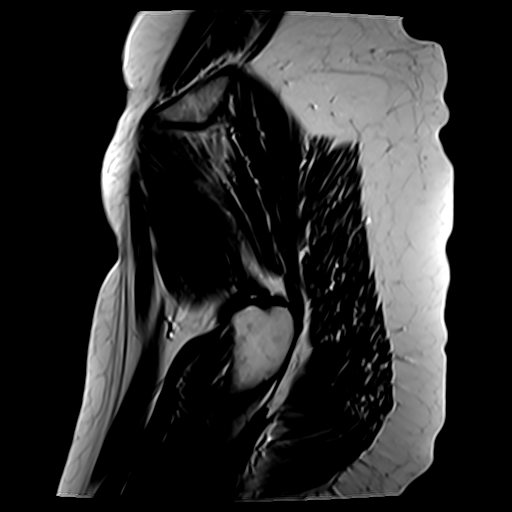
[im 10/40]
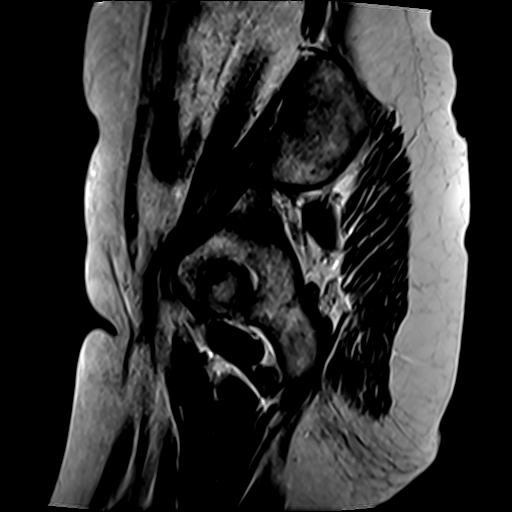
[im 20/40]
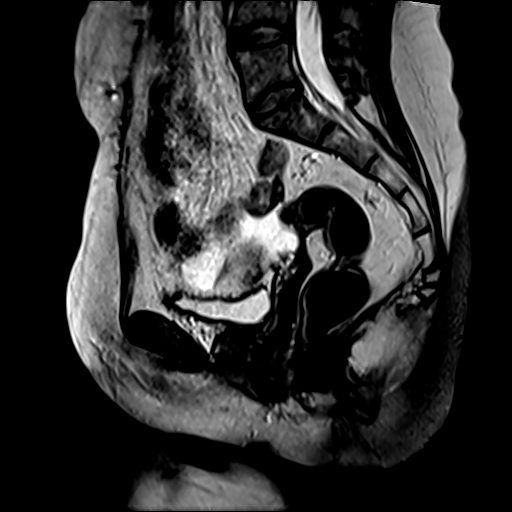
[im 30/40]
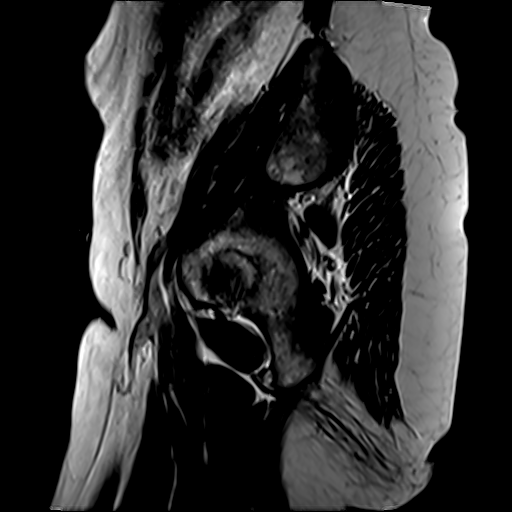
[im 40/40]
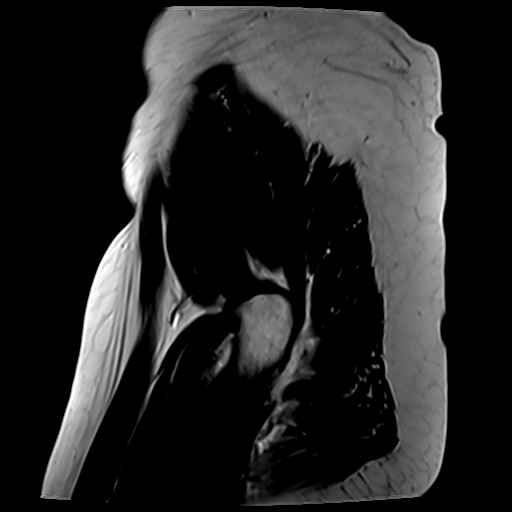

[Series 6: T1 · axial · 5.0mm · 0.51mm/px · z∈[-107,+31]mm · 3 of 36 slices shown]
[im 1/36]
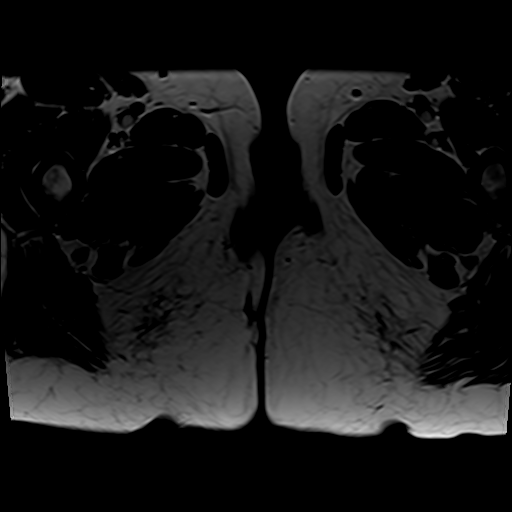
[im 12/36]
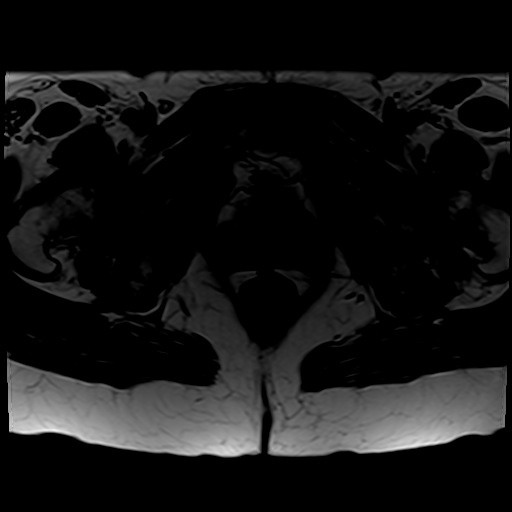
[im 24/36]
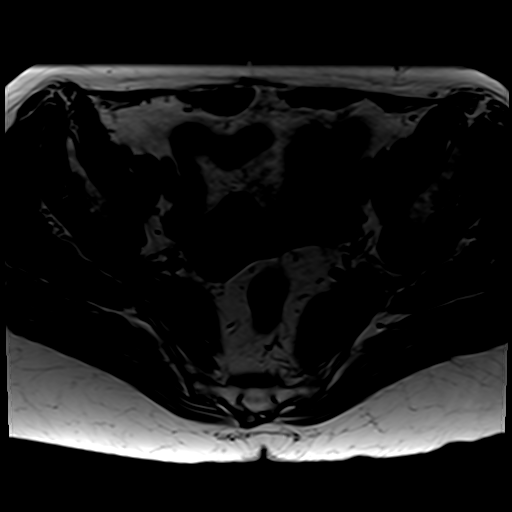

[22 of 48 positions shown; findings below may reference images not displayed]

FINDINGS: Urinary Tract: The visualized distal ureters and bladder appear
unremarkable.

Bowel: Status post descending colostomy. There is mild wall
thickening and enhancement in the [place]. No surrounding
inflammatory changes.

Vascular/Lymphatic: No enlarged pelvic lymph nodes identified. No
significant vascular findings.

Reproductive:

Uterus: Measures 5.9 x 3.0 x 4.3 cm. No fibroids or other masses
identified.

Endometrium: No significant endometrial thickening. Congenital
uterine abnormality with splaying of the endometrial canal the
fundal region.

Cervix/Vagina:  Appears normal.

Right ovary:  No clear right ovarian tissue identified.

Left ovary:  Appears normal.  No mass identified.

Other: There is an irregular fluid collection within the right
adnexa which demonstrates intermediate T1 and high T2 signal. This
fluid collection is irregularly marginated, measuring approximately
6.4 x 5.5 x 7.0 cm. It demonstrates no solid components or abnormal
enhancement following contrast. This has decreased in size from the
8852 CT at which time it measured 12.2 x 8.3 cm. Previously
identified oval ring-like density superiorly in the collection is no
longer visualized. No new or enlarging fluid-fluid collections or
inflammatory changes.

Musculoskeletal: No acute or worrisome osseous findings. Stable
chronic bilateral femoral head avascular necrosis without
subchondral collapse.
IMPRESSION: 1. Interval decrease in size of complex fluid collection within the
right adnexa compared with 8852 CT, most consistent with a
peritoneal inclusion cyst.
2. No new or enlarging fluid collections, soft tissue masses or
abnormal enhancement.
3. Mild wall thickening and enhancement in the [place], likely
inflammatory.

## 2021-03-22 ENCOUNTER — Other Ambulatory Visit: Payer: Self-pay | Admitting: Family Medicine

## 2022-04-29 ENCOUNTER — Ambulatory Visit (INDEPENDENT_AMBULATORY_CARE_PROVIDER_SITE_OTHER): Payer: Medicare (Managed Care) | Admitting: Family Medicine

## 2022-04-29 ENCOUNTER — Encounter: Payer: Self-pay | Admitting: Family Medicine

## 2022-04-29 VITALS — BP 133/68 | HR 75 | Ht 68.0 in | Wt 140.6 lb

## 2022-04-29 DIAGNOSIS — Z1231 Encounter for screening mammogram for malignant neoplasm of breast: Secondary | ICD-10-CM

## 2022-04-29 DIAGNOSIS — K50818 Crohn's disease of both small and large intestine with other complication: Secondary | ICD-10-CM

## 2022-04-29 DIAGNOSIS — E538 Deficiency of other specified B group vitamins: Secondary | ICD-10-CM | POA: Diagnosis not present

## 2022-04-29 DIAGNOSIS — Z1159 Encounter for screening for other viral diseases: Secondary | ICD-10-CM

## 2022-04-29 NOTE — Assessment & Plan Note (Addendum)
Advised to follow-up with Dr. [name], new referral placed as most recent visit was in 2018. Will fill out paperwork for colostomy supplies. Will check CBC and BMP. ?

## 2022-04-29 NOTE — Assessment & Plan Note (Signed)
Related to Crohn's disease. Previously receiving injections. Will re-check levels today. ?

## 2022-04-29 NOTE — Patient Instructions (Addendum)
It was nice seeing you today! ? ?Schedule your pap smear at your convenience. ? ?Referral placed for GI Dr. [name]. Make sure to schedule with him soon. ? ?Stay well, ?Zola Button, MD ?[place] ?([phone] ? ?-- ? ?Make sure to check out at the front desk before you leave today. ? ?Please arrive at least 15 minutes prior to your scheduled appointments. ? ?If you had blood work today, I will send you a MyChart message or a letter if results are normal. Otherwise, I will give you a call. ? ?If you had a referral placed, they will call you to set up an appointment. Please give [place] a call if you don't hear back in the next 2 weeks. ? ?If you need additional refills before your next appointment, please call your pharmacy first.  ?

## 2022-04-29 NOTE — Progress Notes (Signed)
? ? ?  SUBJECTIVE:  ? ?CHIEF COMPLAINT / HPI:  ?Chief Complaint  ?Patient presents with  ? Form Completion  ?  ?Needs paperwork filled out for colostomy supplies. She has not been seen in office since 02/27/2020, dealing with the death of her father and had not thought about coming to the doctor. Last visit with GI Dr. [name] was in 02/26/2017. She has had intermittent cramping abdominal pain due to Crohn's which she states is tolerable. She has always had issues with diarrhea due to Crohn's. No concerns regarding her colostomy. ? ?Lives with boyfriend of 12 years, previously divorced. 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 ? ?PERTINENT  PMH / PSH: Crohn's disease, tobacco abuse ? ?Patient Care Team: ?Zola Button, MD as PCP - General (Family Medicine)  ? ?OBJECTIVE:  ? ?BP 133/68   Pulse 75   Ht 5' 8"  (1.727 m)   Wt 140 lb 9.6 oz (63.8 kg)   SpO2 96%   BMI 21.38 kg/m?   ?Physical Exam ?Constitutional:   ?   General: She is not in acute distress. ?HENT:  ?   Head: Normocephalic and atraumatic.  ?Cardiovascular:  ?   Rate and Rhythm: Normal rate and regular rhythm.  ?Pulmonary:  ?   Effort: Pulmonary effort is normal. No respiratory distress.  ?   Breath sounds: Normal breath sounds.  ?Abdominal:  ?   Palpations: Abdomen is soft.  ?   Tenderness: There is no abdominal tenderness.  ?   Comments: Colostomy site in left lower abdomen appears clean without surrounding erythema, colostomy bag currently empty.  ?Musculoskeletal:  ?   Cervical back: Neck supple.  ?Neurological:  ?   Mental Status: She is alert.  ?  ? ? ?  04/29/2022  ?  3:01 PM  ?Depression screen PHQ 2/9  ?Decreased Interest 0  ?Down, Depressed, Hopeless 0  ?PHQ - 2 Score 0  ?Altered sleeping 0  ?Tired, decreased energy 1  ?Change in appetite 0  ?Feeling bad or failure about yourself  0  ?Trouble concentrating 0  ?Moving slowly or fidgety/restless 0  ?Suicidal thoughts 0  ?PHQ-9 Score 1  ?Difficult doing work/chores Somewhat difficult  ?   ? ?{Show previous vital signs (optional):23777} ? ? ? ?ASSESSMENT/PLAN:  ? ?Vitamin B12 deficiency ?Related to Crohn's disease. Previously receiving injections. Will re-check levels today. ? ?CROHN'S DISEASE, LARGE AND SMALL INTESTINES ?Advised to follow-up with Dr. [name], new referral placed as most recent visit was in February 26, 2017. Will fill out paperwork for colostomy supplies. Will check CBC and BMP. ?  ? ?HCM ?- HCV screening ordered ?- Tdap - amenable to receiving at next visit ?- pap smear - advised to schedule appt for this ?- mammogram - ordered ?- Shingles vaccine needs, will discuss at follow-up ? ?Address tobacco use in depth at follow-up ? ?Return in about 1 year (around 04/30/2023) for physical.  ? ?Zola Button, MD ?[place]  ? ?

## 2022-04-30 LAB — CBC
Hematocrit: 40.5 % (ref 34.0–46.6)
Hemoglobin: 14.2 g/dL (ref 11.1–15.9)
MCH: 33.8 pg — ABNORMAL HIGH (ref 26.6–33.0)
MCHC: 35.1 g/dL (ref 31.5–35.7)
MCV: 96 fL (ref 79–97)
Platelets: 253 10*3/uL (ref 150–450)
RBC: 4.2 x10E6/uL (ref 3.77–5.28)
RDW: 12.2 % (ref 11.7–15.4)
WBC: 5.7 10*3/uL (ref 3.4–10.8)

## 2022-04-30 LAB — HCV AB W REFLEX TO QUANT PCR: HCV Ab: NONREACTIVE

## 2022-04-30 LAB — VITAMIN B12: Vitamin B-12: 209 pg/mL — ABNORMAL LOW (ref 232–1245)

## 2022-04-30 LAB — BASIC METABOLIC PANEL
BUN/Creatinine Ratio: 12 (ref 9–23)
BUN: 9 mg/dL (ref 6–24)
CO2: 26 mmol/L (ref 20–29)
Calcium: 9.7 mg/dL (ref 8.7–10.2)
Chloride: 101 mmol/L (ref 96–106)
Creatinine, Ser: 0.78 mg/dL (ref 0.57–1.00)
Glucose: 78 mg/dL (ref 70–99)
Potassium: 3.6 mmol/L (ref 3.5–5.2)
Sodium: 139 mmol/L (ref 134–144)
eGFR: 91 mL/min/{1.73_m2} (ref >59)

## 2022-04-30 LAB — HCV INTERPRETATION

## 2022-05-05 ENCOUNTER — Telehealth: Payer: Self-pay | Admitting: *Deleted

## 2022-05-05 MED ORDER — VITAMIN B-12 1000 MCG PO TABS: 1000.0000 ug | ORAL_TABLET | Freq: Every day | ORAL | 1 refills | Status: AC

## 2022-05-05 NOTE — Telephone Encounter (Signed)
Pt in clinic with father n law today and said she saw her labs and wanted to see if you would send in B-12 since hers was low. Lainy Wrobleski Zimmerman Rumple, CMA ? ?

## 2022-05-05 NOTE — Telephone Encounter (Signed)
Pt informed of below.Monique Hefty Zimmerman Rumple, CMA ? ?

## 2022-05-05 NOTE — Telephone Encounter (Signed)
Prescription sent in for vitamin B12 supplement, we can re-check levels again at her next visit. Please let patient know. ?

## 2022-05-12 ENCOUNTER — Ambulatory Visit
Admission: RE | Admit: 2022-05-12 | Discharge: 2022-05-12 | Disposition: A | Discharge status: Home / Self Care | Payer: Medicare (Managed Care) | Source: Ambulatory Visit | Attending: Family Medicine | Admitting: Family Medicine

## 2022-05-12 DIAGNOSIS — Z1231 Encounter for screening mammogram for malignant neoplasm of breast: Secondary | ICD-10-CM

## 2022-05-12 NOTE — Patient Instructions (Addendum)
It was nice seeing you today! ? ?Come back for your pap smear when it is convenient for you. ? ?Try the nicotine patches again. ? ?Make sure to call GI for an appointment. ? ?Stay well, ?Zola Button, MD ?[place] ?[phone] ? ?-- ? ?Make sure to check out at the front desk before you leave today. ? ?Please arrive at least 15 minutes prior to your scheduled appointments. ? ?If you had blood work today, I will send you a MyChart message or a letter if results are normal. Otherwise, I will give you a call. ? ?If you had a referral placed, they will call you to set up an appointment. Please give [place] a call if you don't hear back in the next 2 weeks. ? ?If you need additional refills before your next appointment, please call your pharmacy first.  ?

## 2022-05-12 NOTE — Progress Notes (Signed)
? ? ?  SUBJECTIVE:  ? ?CHIEF COMPLAINT / HPI:  ?Chief Complaint  ?Patient presents with  ? Follow-up  ?  ?Scheduled for pap smear but patient did not want to get this done today as she just got off work. She will plan to reschedule. ? ?1 ppd smoker currently (15-20 cigarettes per day). Trying to cut back, has been using regular gum. Working has been helping (staying busy). She used to use nicotine patches which were helpful. She was prescribed varenicline in the past but she stopped taking it due to nausea and did not feel it was helping. ? ?On disabilities but still works part time at a [employer]. May be taken off of disabilities due to income too high. ? 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  ? ?PERTINENT  PMH / PSH: Crohn's disease, vitamin B12 deficiency, tobacco abuse ? ?Patient Care Team: ?Zola Button, MD as PCP - General (Family Medicine)  ? ?OBJECTIVE:  ? ?BP 133/76   Pulse 72   Wt 141 lb 3.2 oz (64 kg)   SpO2 99%   BMI 21.47 kg/m?   ?Physical Exam ?Constitutional:   ?   General: She is not in acute distress. ?Cardiovascular:  ?   Rate and Rhythm: Normal rate and regular rhythm.  ?Pulmonary:  ?   Effort: Pulmonary effort is normal. No respiratory distress.  ?   Breath sounds: Normal breath sounds.  ?Musculoskeletal:  ?   Cervical back: Neck supple.  ?Neurological:  ?   Mental Status: She is alert.  ?  ? ? ?  04/29/2022  ?  3:01 PM  ?Depression screen PHQ 2/9  ?Decreased Interest 0  ?Down, Depressed, Hopeless 0  ?PHQ - 2 Score 0  ?Altered sleeping 0  ?Tired, decreased energy 1  ?Change in appetite 0  ?Feeling bad or failure about yourself  0  ?Trouble concentrating 0  ?Moving slowly or fidgety/restless 0  ?Suicidal thoughts 0  ?PHQ-9 Score 1  ?Difficult doing work/chores Somewhat difficult  ?  ? ?{Show previous vital signs (optional):23777} ? ? ? ?ASSESSMENT/PLAN:  ? ?Tobacco abuse ?Counseling provided today. She will start OTC nicotine patches,  declined prescription. ? ?Vitamin B12 deficiency ?Mildly low, just started vitamin B12 supplementation. Will plan to recheck levels in the next few months. ?  ?HCM ?- Tdap rx given ?- will reschedule pap smear ?- Covid vaccine declined ? ?Return for pap smear.  ? ?Zola Button, MD ?[place]  ?

## 2022-05-13 ENCOUNTER — Ambulatory Visit (INDEPENDENT_AMBULATORY_CARE_PROVIDER_SITE_OTHER): Payer: Medicare (Managed Care) | Admitting: Family Medicine

## 2022-05-13 ENCOUNTER — Encounter: Payer: Self-pay | Admitting: Family Medicine

## 2022-05-13 VITALS — BP 133/76 | HR 72 | Wt 141.2 lb

## 2022-05-13 DIAGNOSIS — Z720 Tobacco use: Secondary | ICD-10-CM

## 2022-05-13 DIAGNOSIS — E538 Deficiency of other specified B group vitamins: Secondary | ICD-10-CM

## 2022-05-13 DIAGNOSIS — Z716 Tobacco abuse counseling: Secondary | ICD-10-CM | POA: Diagnosis not present

## 2022-05-13 MED ORDER — TETANUS-DIPHTH-ACELL PERTUSSIS 5-2.5-18.5 LF-MCG/0.5 IM SUSP: 0.5000 mL | Freq: Once | INTRAMUSCULAR | 0 refills | Status: AC

## 2022-05-13 NOTE — Assessment & Plan Note (Addendum)
Counseling provided today. She will start OTC nicotine patches, declined prescription. ?

## 2022-05-13 NOTE — Assessment & Plan Note (Signed)
Mildly low, just started vitamin B12 supplementation. Will plan to recheck levels in the next few months. ?

## 2022-05-14 ENCOUNTER — Encounter: Payer: Self-pay | Admitting: Internal Medicine

## 2022-06-03 ENCOUNTER — Encounter: Payer: Self-pay | Admitting: *Deleted

## 2022-06-18 ENCOUNTER — Encounter: Payer: Self-pay | Admitting: Internal Medicine

## 2022-06-18 ENCOUNTER — Ambulatory Visit (INDEPENDENT_AMBULATORY_CARE_PROVIDER_SITE_OTHER): Payer: Medicare (Managed Care) | Admitting: Internal Medicine

## 2022-06-18 VITALS — BP 108/70 | HR 80 | Ht 66.0 in | Wt 142.1 lb

## 2022-06-18 DIAGNOSIS — Z86010 Personal history of colonic polyps: Secondary | ICD-10-CM

## 2022-06-18 DIAGNOSIS — Z720 Tobacco use: Secondary | ICD-10-CM

## 2022-06-18 DIAGNOSIS — E538 Deficiency of other specified B group vitamins: Secondary | ICD-10-CM

## 2022-06-18 DIAGNOSIS — K50818 Crohn's disease of both small and large intestine with other complication: Secondary | ICD-10-CM

## 2022-06-18 MED ORDER — SUTAB 1479-225-188 MG PO TABS: 1.0000 | ORAL_TABLET | Freq: Once | ORAL | 0 refills | Status: AC

## 2022-06-18 NOTE — Progress Notes (Signed)
HISTORY OF PRESENT ILLNESS:  [name] is a 51 y.o. female with complicated Crohn's disease for which she underwent partial resection of the ileum and colon with subsequent colostomy May 2010. Also a history of perirectal abscess, B12 deficiency, chronic tobacco abuse, suboptimal medical compliance, and anxiety/depression. Last colonoscopy the of the ostomy April 2016. The surgical anastomosis was unremarkable. The ileum revealed rare tiny aphthoid ulcer. Colonic mucosa was normal except for an incidental diminutive tubular adenoma which was removed. She was seen in this office 06/19/2016 and September 29, 2017. See those dictations.  The impression and plan from last visit is as follows: ASSESSMENT:   #1. Complicated Crohn's disease with surgery as described. Last colonoscopy 2016 without active disease. CT scan last year without active disease. Question of perirectal fistula raised #2. Complex cystic mass in the right adnexa. Diagnosed on CT June 2017. The patient has not seen a GYN as we requested. We previously offered to arrange but she declined. #3. Waiting to obtain PCP, still. #4. History of B12 deficiency. Last level low. Has not been on replacement #5. Ongoing tobacco abuse     PLAN:   #1. We had arranged for ASAP GYN referral. She will be contacted by the women[facility] clinic. #2. We have reached out to family practice to expedite her as a patient #3. We will prescribe B12 and initiate replacement therapy in this office until she finds a PCP #4. Laboratories today including comprehensive metabolic panel, CBC, ESR, and B12 level. We will contact the patient with the results #5. Stop smoking! #6. Routine GI follow-up one year. Sooner if needed  The patient had GYN evaluation with Dr. Nori Riis.  Adnexal mass felt to be benign.  Has been followed by gynecology.  Did have pelvic MRI 2021.  She did establish with family practice.  Recently seen by Dr. Nancy Fetter.  Noted to be B12 deficient.  Started  on oral replacement for 1 month now.  She tells me that she will follow-up with him in this regard.  She understands that she may need injections if B12 not absorbed well through the gut.  She continues to smoke.  She was to follow-up in our office 1 year from her last visit, but did not.  I was sorry to hear that her father died.  She is living in his home.  She is now working part-time but moving toward full-time employment.  She is sent today by her primary care provider regarding surveillance colonoscopy.  She was due in 2021.  Was sent recall letter.  She tells me that she is doing well.  No particular issues with her ostomy.  She does have an area that is tender, but has been so for years.  No different.  She was scheduled for surveillance colonoscopy.  She is on no medications for her Crohn's.  REVIEW OF SYSTEMS:  All non-GI ROS negative unless otherwise stated in the HPI.  Past Medical History:  Diagnosis Date   Alcoholism ([place])    Anal fissure    B12 deficiency    Colon polyp    Crohn's disease ([place])    Large and small intestine - Followed by Dr. [name]   Depression 12/18/2011   pt states "its not worth living anymore"    Enteritis (regional)    small and large intestine   Family history of breast cancer    1st cousin   Fatigue    IBS (irritable bowel syndrome)    Perirectal abscess 03/2009  Treated with antibiotics.    Perirectal abscess 1991   Vitamin B12 deficiency    185 (211-911 normal) in 12/10    Past Surgical History:  Procedure Laterality Date   abscess removal  1991 and 2010   COLON RESECTION  04/28/2009   Partial resection of ileum and colon now with colostomy  bag   RECTAL FISTULOTOMY     X2   rectal fistulotomy     SMALL INTESTINE SURGERY  04/28/2009   small and large bowel resection    Social History [name]  reports that she has been smoking cigarettes. She started smoking about 35 years ago. She has been smoking an average of 1 pack per  day. She has never used smokeless tobacco. She reports that she does not drink alcohol and does not use drugs.  family history includes Alcohol abuse in her father; Breast cancer in her cousin; Cervical cancer in her maternal grandmother; Colitis in her sister; Colon cancer in her maternal aunt; Crohn's disease in her father, mother, and sister; Diabetes in her cousin and father; Heart disease in her father; Hyperlipidemia in her father; Hypertension in her father; Osteoporosis in her mother; Ovarian cancer in her maternal grandmother; Prostate cancer in her paternal grandfather.  No Known Allergies     PHYSICAL EXAMINATION: Vital signs: BP 108/70 (BP Location: Left Arm, Patient Position: Sitting, Cuff Size: Normal)   Pulse 80   Ht 5' 6"  (1.676 m) Comment: height measured without shoes  Wt 142 lb 2 oz (64.5 kg)   BMI 22.94 kg/m   Constitutional: generally well-appearing, no acute distress Psychiatric: alert and oriented x3, cooperative Eyes: extraocular movements intact, anicteric, conjunctiva pink Mouth: oral pharynx moist, no lesions Neck: supple no lymphadenopathy Cardiovascular: heart regular rate and rhythm, no murmur Lungs: clear to auscultation bilaterally Abdomen: soft, nontender, nondistended, no obvious ascites, no peritoneal signs, normal bowel sounds, no organomegaly.  Previous surgical incisions well-healed.  Healthy appearing stoma with some granulation tissue on the inferior portion Rectal: Omitted Extremities: no clubbing, cyanosis, or lower extremity edema bilaterally Skin: no lesions on visible extremities Neuro: No focal deficits.  Cranial nerves intact  ASSESSMENT:  1.  Complicated Crohn's disease with surgery as described.  Last colonoscopy 2016 without active disease. 2.  History of adenomatous colon polyps.  Overdue for surveillance 3.  Cystic mass in right adnexa.  Has had GYN evaluation. 4.  Did establish with PCP 5.  B12 deficiency.  Being managed by Dr.  Nancy Fetter 6.  Continues to smoke   PLAN:  1.  Schedule colonoscopy for polyp surveillance and Crohn's surveillance. 2.  Continue B12 replacement indefinitely.  PCP to monitor 3.  Stop smoking 4.  She really should have at least annual office follow-ups with GI.  Her suboptimal/noncompliance remains an issue A total time of 45 minutes was spent obtaining to see the patient, reviewing outside records, reviewing outside laboratories, reviewing outside imaging, obtaining comprehensive history, performing medically appropriate physical examination, counseling and educating patient regarding the above listed issues, ordering colonoscopy, and documenting clinical information in the health record

## 2022-06-18 NOTE — Patient Instructions (Signed)
If you are age 53 or older, your body mass index should be between 23-30. Your Body mass index is 22.94 kg/m. If this is out of the aforementioned range listed, please consider follow up with your Primary Care Provider.  If you are age 22 or younger, your body mass index should be between 19-25. Your Body mass index is 22.94 kg/m. If this is out of the aformentioned range listed, please consider follow up with your Primary Care Provider.   ________________________________________________________  The [facility] providers would like to encourage you to use [facility] to communicate with providers for non-urgent requests or questions.  Due to long hold times on the telephone, sending your provider a message by [facility] may be a faster and more efficient way to get a response.  Please allow 48 business hours for a response.  Please remember that this is for non-urgent requests.  _______________________________________________________  Dennis Bast have been scheduled for a colonoscopy. Please follow written instructions given to you at your visit today.  Please pick up your prep supplies at the pharmacy within the next 1-3 days. If you use inhalers (even only as needed), please bring them with you on the day of your procedure.

## 2022-06-30 ENCOUNTER — Telehealth: Payer: Self-pay | Admitting: *Deleted

## 2022-06-30 NOTE — Telephone Encounter (Signed)
LVM to call office back to see about scheduling an appointment for her PAP.  If she calls back please assist her in getting this scheduled at her earliest convenience. Peyten Punches Zimmerman Rumple, CMA

## 2022-06-30 NOTE — Telephone Encounter (Signed)
LVM to call office back to see about scheduling an appointment for her PAP.  If she calls back please assist her in getting this scheduled at her earliest convenience. Eunice Winecoff Zimmerman Rumple, CMA

## 2022-08-08 ENCOUNTER — Encounter: Payer: Self-pay | Admitting: Internal Medicine

## 2022-08-08 ENCOUNTER — Ambulatory Visit (AMBULATORY_SURGERY_CENTER): Payer: Medicare (Managed Care) | Admitting: Internal Medicine

## 2022-08-08 VITALS — BP 115/65 | HR 63 | Temp 97.7°F | Resp 14 | Ht 66.0 in | Wt 142.0 lb

## 2022-08-08 DIAGNOSIS — D123 Benign neoplasm of transverse colon: Secondary | ICD-10-CM | POA: Diagnosis not present

## 2022-08-08 DIAGNOSIS — K50818 Crohn's disease of both small and large intestine with other complication: Secondary | ICD-10-CM | POA: Diagnosis present

## 2022-08-08 DIAGNOSIS — Z09 Encounter for follow-up examination after completed treatment for conditions other than malignant neoplasm: Secondary | ICD-10-CM

## 2022-08-08 DIAGNOSIS — Z86010 Personal history of colonic polyps: Secondary | ICD-10-CM | POA: Diagnosis not present

## 2022-08-08 MED ORDER — SODIUM CHLORIDE 0.9 % IV SOLN: 500.0000 mL | INTRAVENOUS | Status: DC

## 2022-08-08 NOTE — Progress Notes (Signed)
HISTORY OF PRESENT ILLNESS:   [name] is a 51 y.o. female with complicated Crohn's disease for which she underwent partial resection of the ileum and colon with subsequent colostomy May 2010. Also a history of perirectal abscess, B12 deficiency, chronic tobacco abuse, suboptimal medical compliance, and anxiety/depression. Last colonoscopy the of the ostomy April 2016. The surgical anastomosis was unremarkable. The ileum revealed rare tiny aphthoid ulcer. Colonic mucosa was normal except for an incidental diminutive tubular adenoma which was removed. She was seen in this office 06/19/2016 and September 29, 2017. See those dictations.  The impression and plan from last visit is as follows: ASSESSMENT:   #1. Complicated Crohn's disease with surgery as described. Last colonoscopy 2016 without active disease. CT scan last year without active disease. Question of perirectal fistula raised #2. Complex cystic mass in the right adnexa. Diagnosed on CT June 2017. The patient has not seen a GYN as we requested. We previously offered to arrange but she declined. #3. Waiting to obtain PCP, still. #4. History of B12 deficiency. Last level low. Has not been on replacement #5. Ongoing tobacco abuse     PLAN:   #1. We had arranged for ASAP GYN referral. She will be contacted by the [facility]. #2. We have reached out to family practice to expedite her as a patient #3. We will prescribe B12 and initiate replacement therapy in this office until she finds a PCP #4. Laboratories today including comprehensive metabolic panel, CBC, ESR, and B12 level. We will contact the patient with the results #5. Stop smoking! #6. Routine GI follow-up one year. Sooner if needed   The patient had GYN evaluation with Dr. Nori Riis.  Adnexal mass felt to be benign.  Has been followed by gynecology.  Did have pelvic MRI 2021.  She did establish with family practice.  Recently seen by Dr. Nancy Fetter.  Noted to be B12 deficient.   Started on oral replacement for 1 month now.  She tells me that she will follow-up with him in this regard.  She understands that she may need injections if B12 not absorbed well through the gut.  She continues to smoke.  She was to follow-up in our office 1 year from her last visit, but did not.  I was sorry to hear that her father died.  She is living in his home.  She is now working part-time but moving toward full-time employment.  She is sent today by her primary care provider regarding surveillance colonoscopy.  She was due in 2021.  Was sent recall letter.  She tells me that she is doing well.  No particular issues with her ostomy.  She does have an area that is tender, but has been so for years.  No different.  She was scheduled for surveillance colonoscopy.  She is on no medications for her Crohn's.   REVIEW OF SYSTEMS:   All non-GI ROS negative unless otherwise stated in the HPI.       Past Medical History:  Diagnosis Date   Alcoholism ([place])     Anal fissure     B12 deficiency     Colon polyp     Crohn's disease ([place])      Large and small intestine - Followed by Dr. [name]   Depression 12/18/2011    pt states "its not worth living anymore"    Enteritis (regional)      small and large intestine   Family history of breast cancer  1st cousin   Fatigue     IBS (irritable bowel syndrome)     Perirectal abscess 03/2009    Treated with antibiotics.    Perirectal abscess 1991   Vitamin B12 deficiency      185 (211-911 normal) in 12/10           Past Surgical History:  Procedure Laterality Date   abscess removal   1991 and 2010   COLON RESECTION   04/28/2009    Partial resection of ileum and colon now with colostomy  bag   RECTAL FISTULOTOMY        X2   rectal fistulotomy       SMALL INTESTINE SURGERY   04/28/2009    small and large bowel resection      Social History [name]  reports that she has been smoking cigarettes. She started smoking about 35 years  ago. She has been smoking an average of 1 pack per day. She has never used smokeless tobacco. She reports that she does not drink alcohol and does not use drugs.   family history includes Alcohol abuse in her father; Breast cancer in her cousin; Cervical cancer in her maternal grandmother; Colitis in her sister; Colon cancer in her maternal aunt; Crohn's disease in her father, mother, and sister; Diabetes in her cousin and father; Heart disease in her father; Hyperlipidemia in her father; Hypertension in her father; Osteoporosis in her mother; Ovarian cancer in her maternal grandmother; Prostate cancer in her paternal grandfather.   No Known Allergies       PHYSICAL EXAMINATION: Vital signs: BP 108/70 (BP Location: Left Arm, Patient Position: Sitting, Cuff Size: Normal)   Pulse 80   Ht _0  (1.676 m) Comment: height measured without shoes  Wt 142 lb 2 oz (64.5 kg)   BMI 22.94 kg/m   Constitutional: generally well-appearing, no acute distress Psychiatric: alert and oriented x3, cooperative Eyes: extraocular movements intact, anicteric, conjunctiva pink Mouth: oral pharynx moist, no lesions Neck: supple no lymphadenopathy Cardiovascular: heart regular rate and rhythm, no murmur Lungs: clear to auscultation bilaterally Abdomen: soft, nontender, nondistended, no obvious ascites, no peritoneal signs, normal bowel sounds, no organomegaly.  Previous surgical incisions well-healed.  Healthy appearing stoma with some granulation tissue on the inferior portion Rectal: Omitted Extremities: no clubbing, cyanosis, or lower extremity edema bilaterally Skin: no lesions on visible extremities Neuro: No focal deficits.  Cranial nerves intact   ASSESSMENT:   1.  Complicated Crohn's disease with surgery as described.  Last colonoscopy 2016 without active disease. 2.  History of adenomatous colon polyps.  Overdue for surveillance 3.  Cystic mass in right adnexa.  Has had GYN evaluation. 4.  Did  establish with PCP 5.  B12 deficiency.  Being managed by Dr. Nancy Fetter 6.  Continues to smoke     PLAN:   1.  Schedule colonoscopy for polyp surveillance and Crohn's surveillance. 2.  Continue B12 replacement indefinitely.  PCP to monitor 3.  Stop smoking  No interval change

## 2022-08-08 NOTE — Op Note (Signed)
[facility] Patient Name: [name] Procedure Date: 08/08/2022 11:43 AM MRN: [id number] Endoscopist: Docia Chuck. [name] , MD Age: 51 Referring MD:  Date of Birth: [birth year] Gender: Female Account #: [id number] Procedure:                Colonoscopy with cold snare polypectomy x 2 Indications:              High risk colon cancer surveillance: Personal                            history of non-advanced adenoma, High risk colon                            cancer surveillance: Crohn's colitis of 8 (or more)                            years duration. Complicated disease requiring ileal                            right colectomy 2010. 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 Last colonoscopy 2016. Has been on no                            medication for her Crohn's. Medicines:                Monitored Anesthesia Care Procedure:                Pre-Anesthesia Assessment:                           - Prior to the procedure, a History and Physical                            was performed, and patient medications and                            allergies were reviewed. The patient's tolerance of                            previous anesthesia was also reviewed. The risks                            and benefits of the procedure and the sedation                            options and risks were discussed with the patient.                            All questions were answered, and informed consent  was obtained. Prior Anticoagulants: The patient has                            taken no previous anticoagulant or antiplatelet                            agents. ASA Grade Assessment: II - A patient with                            mild systemic disease. After reviewing the risks                            and benefits, the patient was deemed in                             satisfactory condition to undergo the procedure.                           After obtaining informed consent, the colonoscope                            was passed under direct vision. Throughout the                            procedure, the patient's blood pressure, pulse, and                            oxygen saturations were monitored continuously. The                            Olympus PCF-H190DL (#[id number]) Colonoscope was                            introduced through the anus and advanced to the the                            ileocolonic anastomosis. The terminal ileum and the                            rectum were photographed. The quality of the bowel                            preparation was excellent. The colonoscopy was                            performed without difficulty. The patient tolerated                            the procedure well. The bowel preparation used was                            SUPREP via split dose instruction. Scope In: 11:57:51 AM Scope Out: 35:70:17 PM Total Procedure Duration: 0 hours 7 minutes 50 seconds  Findings:  The pediatric colonoscope was introduced through                            the stoma. Except for polyps (see below) the                            colonic mucosa was normal. The anastomosis revealed                            1 small aphthous ulcer but was widely patent                            without other abnormalities.                           The neo-terminal ileum was intubated for a distance                            of 20 cm and appeared normal.                           Two polyps were found in the transverse colon. The                            polyps were 2 to 3 mm in size. These polyps were                            removed with a cold snare. Resection and retrieval                            were complete. Complications:            No immediate complications. Estimated blood loss:                             None. Estimated Blood Loss:     Estimated blood loss: none. Impression:               1. Status post ileocecectomy                           2. Status post rectosigmoid colectomy with                            permanent colostomy                           3. 2 diminutive colon polyps removed and submitted                            for pathology                           4. Widely patent anastomosis with a diminutive  aphthous ulcer.                           5. Neoileum deeply intubated and was with normal                            mucosa. No evidence for Crohn's                           6. Normal colonic mucosa. No evidence for Crohn's. Recommendation:           - Repeat colonoscopy in 5 years for surveillance.                           - Patient has a contact number available for                            emergencies. The signs and symptoms of potential                            delayed complications were discussed with the                            patient. Return to normal activities tomorrow.                            Written discharge instructions were provided to the                            patient.                           - Resume previous diet.                           - Continue present medications.                           - Await pathology results. Docia Chuck. [name], MD 08/08/2022 12:21:06 PM This report has been signed electronically.

## 2022-08-08 NOTE — Progress Notes (Signed)
Report to pacu rn. Vss. Care resumed by rn.

## 2022-08-08 NOTE — Progress Notes (Signed)
Called to room to assist during endoscopic procedure.  Patient ID and intended procedure confirmed with present staff. Received instructions for my participation in the procedure from the performing physician.  

## 2022-08-08 NOTE — Patient Instructions (Signed)

## 2022-08-11 ENCOUNTER — Telehealth: Payer: Self-pay

## 2022-08-11 NOTE — Telephone Encounter (Signed)
  Follow up Call-     08/08/2022   11:05 AM  Call back number  Post procedure Call Back phone  # [phone]  Permission to leave phone message Yes     Patient questions:  Do you have a fever, pain , or abdominal swelling? No. Pain Score  0 *  Have you tolerated food without any problems? Yes.    Have you been able to return to your normal activities? Yes.    Do you have any questions about your discharge instructions: Diet   No. Medications  No. Follow up visit  No.  Do you have questions or concerns about your Care? No.  Actions: * If pain score is 4 or above: No action needed, pain <4.

## 2022-08-12 ENCOUNTER — Encounter: Payer: Self-pay | Admitting: Internal Medicine

## 2023-02-11 ENCOUNTER — Encounter: Payer: Self-pay | Admitting: Family Medicine

## 2023-02-11 ENCOUNTER — Ambulatory Visit (INDEPENDENT_AMBULATORY_CARE_PROVIDER_SITE_OTHER): Payer: Medicare (Managed Care) | Admitting: Family Medicine

## 2023-02-11 VITALS — Ht 68.0 in | Wt 145.8 lb

## 2023-02-11 DIAGNOSIS — Z720 Tobacco use: Secondary | ICD-10-CM | POA: Diagnosis not present

## 2023-02-11 DIAGNOSIS — K50818 Crohn's disease of both small and large intestine with other complication: Secondary | ICD-10-CM | POA: Diagnosis not present

## 2023-02-11 DIAGNOSIS — Z0000 Encounter for general adult medical examination without abnormal findings: Secondary | ICD-10-CM | POA: Diagnosis not present

## 2023-02-11 NOTE — Progress Notes (Signed)
Subjective:   [name] is a 52 y.o. female who presents for Medicare Annual (Subsequent) preventive examination.  Patient consented to have virtual visit and was identified by name and date of birth. Method of visit: in person  Encounter participants: Patient: [name] - located at [facility] clinic Nurse/Provider: Sharion Settler - located at [facility] clinic Others (if applicable): None    Objective:     Vitals: Ht 5' 8"$  (1.727 m)   Wt 145 lb 12.8 oz (66.1 kg)   BMI 22.17 kg/m   Body mass index is 22.17 kg/m.     11/30/2018    9:46 AM 09/17/2018    2:23 PM 10/05/2017   11:08 AM  Advanced Directives  Does Patient Have a Medical Advance Directive? No No No  Would patient like information on creating a medical advance directive? Yes (MAU/Ambulatory/Procedural Areas - Information given) No - Patient declined No - Patient declined    Tobacco Social History   Tobacco Use  Smoking Status Every Day   Packs/day: 0.50   Types: Cigarettes   Start date: 1988  Smokeless Tobacco Never  Tobacco Comments   tobacco info given 10/17/17     Ready to quit: Not Answered Counseling given: Not Answered Tobacco comments: tobacco info given 10/17/17   Clinical Intake:     Pain : 0-10 Pain Score: 4  Pain Type: Chronic pain Pain Location: Abdomen Pain Orientation: Anterior Pain Descriptors / Indicators: Burning Pain Onset: In the past 7 days Pain Frequency: Constant     Nutritional Status: BMI of 19-24  Normal Diabetes: No  How often do you need to have someone help you when you read instructions, pamphlets, or other written materials from your doctor or pharmacy?: 1 - Never  Interpreter Needed?: No     Past Medical History:  Diagnosis Date   Alcoholism ([place])    Anal fissure    B12 deficiency    Colon polyp    Crohn's disease ([place])    Large and small intestine - Followed by Dr. [name]   Depression 12/18/2011   pt states "its not worth living  anymore"    Enteritis (regional)    small and large intestine   Family history of breast cancer    1st cousin   Fatigue    IBS (irritable bowel syndrome)    Perirectal abscess 03/2009   Treated with antibiotics.    Perirectal abscess 1991   Vitamin B12 deficiency    185 (211-911 normal) in 12/10   Past Surgical History:  Procedure Laterality Date   abscess removal  1991 and 2010   COLON RESECTION  04/28/2009   Partial resection of ileum and colon now with colostomy  bag   RECTAL FISTULOTOMY     X2   rectal fistulotomy     SMALL INTESTINE SURGERY  04/28/2009   small and large bowel resection   Family History  Problem Relation Age of Onset   Crohn's disease Mother    Osteoporosis Mother    Diabetes Father    Heart disease Father    Hyperlipidemia Father    Alcohol abuse Father    Crohn's disease Father    Hypertension Father    Colitis Sister    Crohn's disease Sister    Cervical cancer Maternal Grandmother        ?   Ovarian cancer Maternal Grandmother        ?   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    High school graduate, disabled.    Social Determinants of Health   Financial Resource Strain: Not on file  Food Insecurity: No Food Insecurity (02/11/2023)   Hunger Vital Sign    Worried About Running Out of Food in the Last Year: Never true    Ran Out of Food in the Last Year: Never true  Transportation Needs: Not on file  Physical Activity: Inactive (02/11/2023)   Exercise Vital Sign    Days of Exercise per Week: 0 days    Minutes of Exercise per Session: 30 min  Stress: Not on file  Social Connections: Unknown (02/11/2023)   Social Connection and Isolation Panel [NHANES]    Frequency of Communication with Friends and Family: Twice a week    Frequency of Social Gatherings with Friends and Family: Not on file    Attends Religious Services: Never    Marine scientist or Organizations: No    Attends Archivist Meetings: Never    Marital Status: Divorced    Outpatient Encounter Medications as of 02/11/2023  Medication Sig   acetaminophen (TYLENOL) 500 MG tablet Take 500 mg by mouth as needed.   vitamin B-12 (CYANOCOBALAMIN) 1000 MCG tablet Take 1 tablet (1,000 mcg total) by mouth daily. (Patient not taking: Reported on 02/11/2023)   No facility-administered encounter medications on file as of 02/11/2023.    Activities of Daily Living    02/11/2023    9:25 AM  In your present state of health, do you have any difficulty performing the following activities:  Hearing? 0  Vision? 0  Difficulty concentrating or making decisions? 0  Walking or climbing stairs? 1  Comment Only with long distances  Dressing or bathing? 0  Doing errands, shopping? 0  Preparing Food and eating ? N  Using the Toilet? N  In the past six months, have you accidently leaked urine? N  Do you have problems with loss of bowel control? Y  Comment Has colostomy  Managing your Medications? N  Managing your Finances? N   Housekeeping or managing your Housekeeping? N    Patient Care Team: Zola Button, MD as PCP - General (Family Medicine)    Assessment:   This is a routine wellness examination for [name].  Exercise Activities and Dietary recommendations     Goals      Activity and Exercise Increased  Evidence-based guidance:  Review current exercise levels.  Assess patient perspective on exercise or activity level, barriers to increasing activity, motivation and readiness for change.  Recommend or set healthy exercise goal based on individual tolerance.  Encourage small steps toward making change in amount of exercise or activity.  Urge reduction of sedentary activities or screen time.  Promote group activities within the community or with family or support person.  Consider referral to rehabiliation therapist for assessment and exercise/activity plan.   Notes:      Pain Management Plan Developed     Evidence-based guidance:  Acknowledge patient as the expert in pain self-management.  Partner to set a comfort goal that allows for return to work, school, usual activity and acceptable quality of life.  Assess pain level, usual behavior with and without pain and symptoms that commonly occur with chronic pain, such as fatigue, sleep problems, cognitive and mood disturbance; use a consistent pain scale.  Determine if pain is associated with mobility or at rest, location, intensity, frequency, duration, recurrence, pattern, triggers, relieving factors and description, such as cramping, burning or aching.   Mutually develop an interdisciplinary, multi-modal and detailed pain management plan with patient and family or caregiver; track the patient's response to pain management and adjust plan as needed.   Notes:      Stop or Cut Down Tobacco Use     Timeframe:  Long-Range Goal Priority:  Medium  - change or avoid triggers like smoky places, drinking alcohol and other smokers - use  over-the-counter gum, patch or lozenges - use Quit Line 1-800-Quit Now    Why is this important?   To stop or cut down it is important to have support from a person or group of people who you can count on.  You will also need to think about the things that make you feel like smoking, then plan for how to handle them.    Notes:         Fall Risk    02/11/2023    9:23 AM 02/01/2020    1:37 PM 11/30/2018    9:47 AM  [place] in the past year? 0 0 0  Number falls in past yr: 0 0 0  Injury with Fall?   0  Risk for fall due to : No Fall Risks    Follow up  Falls evaluation completed    Is the patient's home free of loose throw rugs in walkways, pet beds, electrical cords, etc?   yes      Grab bars in the bathroom? yes      Handrails on the stairs?    No stairs in house      Adequate lighting?   yes  Patient rating of health (0-10) scale: 4-5   Depression Screen    02/11/2023    9:23 AM 04/29/2022    3:01 PM 02/01/2020    1:37 PM 11/30/2018    9:47 AM  PHQ 2/9 Scores  PHQ - 2 Score 0 0 1 0  PHQ- 9 Score  1       Cognitive Function        02/11/2023    9:24 AM  6CIT Screen  What Year? 4 points  What month? 3 points  What time? 0 points  Count back from 20 0 points  Months in reverse 0 points  Repeat phrase 0 points  Total Score 7 points    Immunization History  Administered Date(s) Administered  Influenza,inj,Quad PF,6+ Mos 02/01/2020   Screening Tests Health Maintenance  Topic Date Due   COVID-19 Vaccine (1) Never done   DTaP/Tdap/Td (1 - Tdap) Never done   PAP SMEAR-Modifier  02/20/2012   Zoster Vaccines- Shingrix (1 of 2) Never done   INFLUENZA VACCINE  07/29/2022   Medicare Annual Wellness (AWV)  02/12/2024   MAMMOGRAM  05/12/2024   COLONOSCOPY (Pts 45-28yr Insurance coverage will need to be confirmed)  08/09/2027   Hepatitis C Screening  Completed   HIV Screening  Completed   HPV VACCINES  Aged Out    Cancer Screenings: Lung: Low Dose CT  Chest recommended if Age 54-80years, 20 pack-year currently smoking OR have quit w/in 15years. Patient does qualify, overdue Breast:  Up to date on Mammogram? Yes , next due 04/2024 Up to date of Bone Density/Dexa? No, not yet 63  Colorectal: 07/2022, next due 07/2027 Pap smear: last done 01/2009, was normal but is overdue for repeat   Additional Screenings: : Hepatitis C Screening: Negative in 04/2022     Plan:  Healthcare Maintenance Overdue for Tdap, shingrix, influenza vaccine. Overdue for Pap smear and low dose CT chest for lung cancer screening. Encouraged her to schedule appointment with PCP for this.   Tobacco Use Disorder Patient motivated to cut back on smoking. Resources provided.     I have personally reviewed and noted the following in the patient's chart:   Medical and social history Use of alcohol, tobacco or illicit drugs  Current medications and supplements Functional ability and status Nutritional status Physical activity Advanced directives List of other physicians Hospitalizations, surgeries, and ER visits in previous 12 months Vitals Screenings to include cognitive, depression, and falls Referrals and appointments  In addition, I have reviewed and discussed with patient certain preventive protocols, quality metrics, and best practice recommendations. A written personalized care plan for preventive services as well as general preventive health recommendations were provided to patient.    ASharion Settler DO  02/11/2023

## 2023-02-11 NOTE — Assessment & Plan Note (Signed)
Patient motivated to cut back on smoking. Resources provided.

## 2023-02-11 NOTE — Assessment & Plan Note (Signed)
Overdue for Tdap, shingrix, influenza vaccine. Overdue for Pap smear and low dose CT chest for lung cancer screening. Encouraged her to schedule appointment with PCP for this.

## 2023-02-11 NOTE — Patient Instructions (Signed)
It was great to see you today! I am glad that you are doing well.  We discussed scheduling an appointment with Dr. Nancy Fetter for: CT of your chest for lung cancer screenings Pap smear  Some of the goals we discussed: Cutting back on smoking! I have attached additional information below.  Incorporating more exercise! It is recommended to do 30 minutes at least 5 times a week Finding employment, best of luck!  Tobacco use is damaging to your body. It increases your risk of stroke, heart attack, lung cancer, and serious lung disease in the future. It also reduces your fertility.   Quitting tobacco is the best thing for your health but is a challenge---nicotine, a chemical in cigarettes, is highly addictive.   You can call 1 800 QUIT NOW (1-740-246-4288)---you will be connected with a Artist. They can also mail you nicotine gums, lozenges, and patches to quit.   Ask me about patches (which you wear all day) and gums (which you use when you have a craving) to help you quit.   There are safe, effective medications to help you quit--  Varencline---also called Chantix---- is the most common medication used to help people stop smoking. It starts a low dose and is increased. I recommended choosing a quit date then starting the medication 8 days before this. Side effects include mild headache, difficulty sleeping, and odd dreams. The medication is typically very well tolerated.   Bupropion---also called Zyban---- is started 1 week before your quit date. You take 1 pill for three days then increase to 1 pill twice per day. Side effects include a mild headache and anxiety---this usually goes away. Some patients experience weight loss.

## 2024-06-14 ENCOUNTER — Telehealth

## 2024-07-19 ENCOUNTER — Encounter: Payer: Self-pay | Admitting: Family Medicine

## 2024-07-19 ENCOUNTER — Ambulatory Visit (INDEPENDENT_AMBULATORY_CARE_PROVIDER_SITE_OTHER): Payer: Self-pay | Admitting: Family Medicine

## 2024-07-19 VITALS — BP 123/74 | HR 65 | Ht 68.0 in | Wt 162.0 lb

## 2024-07-19 DIAGNOSIS — Z0000 Encounter for general adult medical examination without abnormal findings: Secondary | ICD-10-CM

## 2024-07-19 DIAGNOSIS — K50818 Crohn's disease of both small and large intestine with other complication: Secondary | ICD-10-CM

## 2024-07-19 DIAGNOSIS — E538 Deficiency of other specified B group vitamins: Secondary | ICD-10-CM

## 2024-07-19 NOTE — Assessment & Plan Note (Signed)
-  reordered ostomy supplies today, patient uses [employer]

## 2024-07-19 NOTE — Assessment & Plan Note (Signed)
-  mammogram and LD CT ordered -pap visit scheduled -Lipid panel and BMP ordered

## 2024-07-19 NOTE — Assessment & Plan Note (Signed)
-  repeat CBC and b12 level -follow up as indicated
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
## 2024-07-19 NOTE — Patient Instructions (Signed)
 It was wonderful to see you today!  It was your annual wellness visit.  We ordered your mammogram and CT scan which you can have done whenever you are able to.  The orders are good for 1 year or so as long as they are completed before July 19, 2025 did not have any issues.  Our referral coordinator will reach out to help you schedule the CT scan, so keep an eye out for that phone call.  The blood work that we have ordered will come back within the next couple of days, if anything is abnormal I will give you a phone call to discuss next steps.  Please call [phone] with any questions about today's appointment.   If you need any additional refills, please call your pharmacy before calling the office.  Lucie Pinal, DO Family Medicine

## 2024-07-20 ENCOUNTER — Ambulatory Visit: Payer: Self-pay | Admitting: Family Medicine

## 2024-07-20 LAB — BASIC METABOLIC PANEL WITH GFR
BUN/Creatinine Ratio: 12 (ref 9–23)
BUN: 9 mg/dL (ref 6–24)
CO2: 24 mmol/L (ref 20–29)
Calcium: 10.1 mg/dL (ref 8.7–10.2)
Chloride: 100 mmol/L (ref 96–106)
Creatinine, Ser: 0.73 mg/dL (ref 0.57–1.00)
Glucose: 79 mg/dL (ref 70–99)
Potassium: 5.1 mmol/L (ref 3.5–5.2)
Sodium: 140 mmol/L (ref 134–144)
eGFR: 98 mL/min/1.73 (ref >59)

## 2024-07-20 LAB — CBC WITH DIFFERENTIAL/PLATELET
Basophils Absolute: 0 x10E3/uL (ref 0.0–0.2)
Basos: 0 %
EOS (ABSOLUTE): 0.1 x10E3/uL (ref 0.0–0.4)
Eos: 1 %
Hematocrit: 47.6 % — ABNORMAL HIGH (ref 34.0–46.6)
Hemoglobin: 15.7 g/dL (ref 11.1–15.9)
Immature Grans (Abs): 0 x10E3/uL (ref 0.0–0.1)
Immature Granulocytes: 0 %
Lymphocytes Absolute: 2.2 x10E3/uL (ref 0.7–3.1)
Lymphs: 38 %
MCH: 32.8 pg (ref 26.6–33.0)
MCHC: 33 g/dL (ref 31.5–35.7)
MCV: 99 fL — ABNORMAL HIGH (ref 79–97)
Monocytes Absolute: 0.5 x10E3/uL (ref 0.1–0.9)
Monocytes: 8 %
Neutrophils Absolute: 3 x10E3/uL (ref 1.4–7.0)
Neutrophils: 52 %
Platelets: 268 x10E3/uL (ref 150–450)
RBC: 4.79 x10E6/uL (ref 3.77–5.28)
RDW: 13.1 % (ref 11.7–15.4)
WBC: 5.9 x10E3/uL (ref 3.4–10.8)

## 2024-07-20 LAB — LIPID PANEL
Chol/HDL Ratio: 2.9 ratio (ref 0.0–4.4)
Cholesterol, Total: 203 mg/dL — ABNORMAL HIGH (ref 100–199)
HDL: 69 mg/dL (ref >39)
LDL Chol Calc (NIH): 109 mg/dL — ABNORMAL HIGH (ref 0–99)
Triglycerides: 147 mg/dL (ref 0–149)
VLDL Cholesterol Cal: 25 mg/dL (ref 5–40)

## 2024-07-20 LAB — VITAMIN B12: Vitamin B-12: 297 pg/mL (ref 232–1245)

## 2024-07-21 ENCOUNTER — Telehealth: Payer: Self-pay

## 2024-07-21 NOTE — Telephone Encounter (Signed)
 DME order for Ostomy Supplies placed by PCP.   I called [employer] for more information on order process.   Order, OV note and demographics needs to be faxed to [phone].  Above placed in fax pile for faxing.

## 2024-09-19 ENCOUNTER — Ambulatory Visit: Payer: Self-pay | Admitting: Family Medicine
